# Patient Record
Sex: Female | Born: 1935 | Race: White | Hispanic: No | Smoking: Never smoker
Health system: Southern US, Community
[De-identification: ages and names within clinical notes are randomized; demographics above are authoritative.]

## PROBLEM LIST (undated history)

## (undated) DIAGNOSIS — I10 Essential (primary) hypertension: Secondary | ICD-10-CM

## (undated) DIAGNOSIS — C801 Malignant (primary) neoplasm, unspecified: Secondary | ICD-10-CM

## (undated) HISTORY — PX: ABDOMINAL HYSTERECTOMY: SHX81

---

## 2005-09-08 ENCOUNTER — Other Ambulatory Visit: Payer: Self-pay

## 2005-09-08 ENCOUNTER — Emergency Department: Payer: Self-pay | Admitting: Emergency Medicine

## 2005-09-21 ENCOUNTER — Emergency Department: Payer: Self-pay | Admitting: Emergency Medicine

## 2005-09-21 ENCOUNTER — Other Ambulatory Visit: Payer: Self-pay

## 2005-09-26 ENCOUNTER — Inpatient Hospital Stay: Payer: Self-pay | Admitting: Internal Medicine

## 2005-09-26 ENCOUNTER — Other Ambulatory Visit: Payer: Self-pay

## 2005-12-10 ENCOUNTER — Ambulatory Visit: Payer: Self-pay | Admitting: Unknown Physician Specialty

## 2006-08-29 ENCOUNTER — Ambulatory Visit: Payer: Self-pay

## 2008-11-05 ENCOUNTER — Ambulatory Visit: Payer: Self-pay | Admitting: Ophthalmology

## 2008-11-19 ENCOUNTER — Ambulatory Visit: Payer: Self-pay | Admitting: Ophthalmology

## 2010-01-15 ENCOUNTER — Emergency Department: Payer: Self-pay | Admitting: Emergency Medicine

## 2012-02-18 ENCOUNTER — Inpatient Hospital Stay: Payer: Self-pay | Admitting: Internal Medicine

## 2012-02-18 LAB — COMPREHENSIVE METABOLIC PANEL
Alkaline Phosphatase: 128 U/L (ref 50–136)
Anion Gap: 8 (ref 7–16)
BUN: 28 mg/dL — ABNORMAL HIGH (ref 7–18)
Bilirubin,Total: 0.3 mg/dL (ref 0.2–1.0)
Creatinine: 1.27 mg/dL (ref 0.60–1.30)
EGFR (African American): 44 — ABNORMAL LOW
Glucose: 134 mg/dL — ABNORMAL HIGH (ref 65–99)
Osmolality: 283 (ref 275–301)
SGPT (ALT): 11 U/L — ABNORMAL LOW (ref 12–78)
Sodium: 138 mmol/L (ref 136–145)
Total Protein: 7.8 g/dL (ref 6.4–8.2)

## 2012-02-18 LAB — CK TOTAL AND CKMB (NOT AT ARMC)
CK, Total: 28 U/L (ref 21–215)
CK-MB: <0.5 ng/mL — ABNORMAL LOW (ref 0.5–3.6)

## 2012-02-18 LAB — CBC
HCT: 32.5 % — ABNORMAL LOW (ref 35.0–47.0)
HGB: 10.6 g/dL — ABNORMAL LOW (ref 12.0–16.0)
MCHC: 32.5 g/dL (ref 32.0–36.0)
Platelet: 517 10*3/uL — ABNORMAL HIGH (ref 150–440)
WBC: 12 10*3/uL — ABNORMAL HIGH (ref 3.6–11.0)

## 2012-02-18 LAB — URINALYSIS, COMPLETE
Ketone: NEGATIVE
Ph: 6 (ref 4.5–8.0)
Protein: NEGATIVE

## 2012-02-18 LAB — LIPASE, BLOOD: Lipase: 168 U/L (ref 73–393)

## 2012-02-18 LAB — TROPONIN I: Troponin-I: <0.02 ng/mL

## 2012-02-19 LAB — HEMOGLOBIN: HGB: 9.5 g/dL — ABNORMAL LOW (ref 12.0–16.0)

## 2012-02-20 LAB — URINE CULTURE

## 2012-02-20 LAB — CBC WITH DIFFERENTIAL/PLATELET
Basophil #: 0.1 10*3/uL (ref 0.0–0.1)
Basophil %: 0.8 %
Eosinophil #: 0.2 10*3/uL (ref 0.0–0.7)
HCT: 29.2 % — ABNORMAL LOW (ref 35.0–47.0)
HGB: 9.9 g/dL — ABNORMAL LOW (ref 12.0–16.0)
Lymphocyte #: 2.3 10*3/uL (ref 1.0–3.6)
Lymphocyte %: 26.8 %
MCH: 29.4 pg (ref 26.0–34.0)
MCHC: 33.9 g/dL (ref 32.0–36.0)
Monocyte #: 0.8 x10 3/mm (ref 0.2–0.9)
Neutrophil #: 5.2 10*3/uL (ref 1.4–6.5)
Neutrophil %: 60.3 %
Platelet: 400 10*3/uL (ref 150–440)
RDW: 13.3 % (ref 11.5–14.5)
WBC: 8.6 10*3/uL (ref 3.6–11.0)

## 2012-02-21 LAB — CBC WITH DIFFERENTIAL/PLATELET
Basophil %: 0.8 %
Eosinophil %: 2 %
HGB: 9.2 g/dL — ABNORMAL LOW (ref 12.0–16.0)
Lymphocyte #: 1.9 10*3/uL (ref 1.0–3.6)
MCH: 27.9 pg (ref 26.0–34.0)
MCHC: 32.3 g/dL (ref 32.0–36.0)
MCV: 87 fL (ref 80–100)
Monocyte %: 9.6 %
Platelet: 379 10*3/uL (ref 150–440)
RBC: 3.27 10*6/uL — ABNORMAL LOW (ref 3.80–5.20)
WBC: 8.8 10*3/uL (ref 3.6–11.0)

## 2012-02-22 LAB — CBC WITH DIFFERENTIAL/PLATELET
Basophil %: 0.8 %
Eosinophil #: 0.2 10*3/uL (ref 0.0–0.7)
Eosinophil %: 2.8 %
HCT: 27.1 % — ABNORMAL LOW (ref 35.0–47.0)
HGB: 9 g/dL — ABNORMAL LOW (ref 12.0–16.0)
MCH: 28.6 pg (ref 26.0–34.0)
MCHC: 33.2 g/dL (ref 32.0–36.0)
MCV: 86 fL (ref 80–100)
Monocyte #: 0.8 x10 3/mm (ref 0.2–0.9)
Monocyte %: 10.3 %
Neutrophil %: 57.8 %
Platelet: 380 10*3/uL (ref 150–440)
RBC: 3.15 10*6/uL — ABNORMAL LOW (ref 3.80–5.20)
WBC: 7.8 10*3/uL (ref 3.6–11.0)

## 2012-02-23 LAB — SEDIMENTATION RATE: Erythrocyte Sed Rate: 52 mm/hr — ABNORMAL HIGH (ref 0–30)

## 2012-02-23 LAB — URIC ACID: Uric Acid: 6.4 mg/dL — ABNORMAL HIGH (ref 2.6–6.0)

## 2012-02-24 LAB — HEMOGLOBIN: HGB: 10.1 g/dL — ABNORMAL LOW (ref 12.0–16.0)

## 2014-01-06 ENCOUNTER — Emergency Department: Payer: Self-pay | Admitting: Emergency Medicine

## 2014-01-06 LAB — CBC
HCT: 36.3 % (ref 35.0–47.0)
HGB: 11.1 g/dL — AB (ref 12.0–16.0)
MCH: 24.6 pg — AB (ref 26.0–34.0)
MCHC: 30.7 g/dL — ABNORMAL LOW (ref 32.0–36.0)
MCV: 80 fL (ref 80–100)
PLATELETS: 318 10*3/uL (ref 150–440)
RBC: 4.52 10*6/uL (ref 3.80–5.20)
RDW: 17.8 % — ABNORMAL HIGH (ref 11.5–14.5)
WBC: 7.9 10*3/uL (ref 3.6–11.0)

## 2014-01-06 LAB — BASIC METABOLIC PANEL
Anion Gap: 5 — ABNORMAL LOW (ref 7–16)
BUN: 22 mg/dL — ABNORMAL HIGH (ref 7–18)
CALCIUM: 8.9 mg/dL (ref 8.5–10.1)
CHLORIDE: 112 mmol/L — AB (ref 98–107)
Co2: 26 mmol/L (ref 21–32)
Creatinine: 1.11 mg/dL (ref 0.60–1.30)
EGFR (African American): 60 — ABNORMAL LOW
GFR CALC NON AF AMER: 49 — AB
Glucose: 105 mg/dL — ABNORMAL HIGH (ref 65–99)
Osmolality: 289 (ref 275–301)
Potassium: 4.6 mmol/L (ref 3.5–5.1)
Sodium: 143 mmol/L (ref 136–145)

## 2014-01-06 LAB — URINALYSIS, COMPLETE
BILIRUBIN, UR: NEGATIVE
Bacteria: NONE SEEN
Blood: NEGATIVE
GLUCOSE, UR: NEGATIVE mg/dL (ref 0–75)
Hyaline Cast: 2
KETONE: NEGATIVE
LEUKOCYTE ESTERASE: NEGATIVE
NITRITE: NEGATIVE
PROTEIN: NEGATIVE
Ph: 5 (ref 4.5–8.0)
RBC,UR: <1 /HPF (ref 0–5)
Specific Gravity: 1.01 (ref 1.003–1.030)
WBC UR: <1 /HPF (ref 0–5)

## 2014-05-17 NOTE — Consult Note (Signed)
CC: GI bleeding.  The duodenal bulb was strictured down and when I passed the scope into the distal area I could see a large ulcer with a small clot in it and not bleeding.  The passage of the scope through the narrowed edematous and friable mid duodenum began oozing some blood.  This was diffuse and no central spot.  I decided not to treat this as cauterization would be very technically difficult due to it being in a strictured area.  it should stop on its own as the mucosa of the stricture comes together when the scope was withdrawn.  Will keep NPO and follow hemoglobins.  Electronic Signatures: Manya Silvas (MD)  (Signed on 25-Jan-14 16:09)  Authored  Last Updated: 25-Jan-14 16:09 by Manya Silvas (MD)

## 2014-05-17 NOTE — Consult Note (Signed)
Pt CC: duodenal ulcer and duodenitis with bleed.  Pt VSS and no abd pain or distention or vomiting.  Will repeat CBC today and start full liquid diet.  Abd flat and soft on exam.  Pt color OK.  Small dose of morphine giving small amt of relief of wrist.  Start liquid tylenol.    Electronic Signatures: Manya Silvas (MD)  (Signed on 27-Jan-14 11:10)  Authored  Last Updated: 27-Jan-14 11:10 by Manya Silvas (MD)

## 2014-05-17 NOTE — Consult Note (Signed)
PATIENT NAME:  Elizabeth Castillo, Elizabeth Castillo MR#:  [id number] DATE OF BIRTH:  02/25/1923  DATE OF CONSULTATION:  02/19/2012  CONSULTING PHYSICIAN:  Manya Silvas, MD  HISTORY OF PRESENT ILLNESS: The patient is an 81 year old white female who was bothered by arthritis in her hands, went to a walk-in clinic, got a prescription for nonsteroidal anti-inflammatory drugs, given a 90-day supply, which she took apparently over a 10-day period. She developed significant epigastric pain and black stool. Was seen in the ER, baseline hemoglobin is supposed to be 13, her hemoglobin was 10 and her stool was positive and black, so she was admitted to the hospital. I was asked to see her in consultation.   REVIEW OF SYSTEMS: She does have epigastric pain, nausea. No vomiting. No diarrhea. Does have melena. She denies chest pain. Denies shortness of breath. Denies fever. No dysuria, no hematuria, no frequency. She has a history of ulcer disease in the past on previous endoscopy. She denies irregular heartbeats. No chest pains.   SOCIAL HISTORY: Nonsmoker, nondrinker.   PAST MEDICAL HISTORY: Hypertension, elevated cholesterol, diverticulosis.   PAST SURGICAL HISTORY: Cholecystectomy, hysterectomy and a history of dysphagia.   FAMILY HISTORY: Father with prostate cancer. Sister with breast cancer. Mother with cancer of unknown type.   MEDICATIONS ON ADMISSION:  1. Sertraline 150 mg a day.  2. HCTZ 25 mg a day.  3. Felodipine 10 mg a day.  4. Flonase nasal spray.   ALLERGIES: No known drug allergies.   PHYSICAL EXAMINATION:  GENERAL: Elderly white female, pleasant, cooperative, in no acute distress.  VITAL SIGNS: Temperature 98.6, pulse 72, respirations 18, blood pressure 112/64, O2 sat 98% on room air.  HEENT: Sclerae nonicteric. Conjunctivae slightly pale. Tongue negative. The head is atraumatic.  CHEST: Clear.  HEART: No murmurs or gallops I can hear.  ABDOMEN: Flat. Bowel sounds are present. No  hepatosplenomegaly. No masses. No bruits. There is mild tenderness in epigastric area.  EXTREMITIES: No edema.  RECTAL: Exam done in the ER showed black heme-positive stool.   LABORATORY DATA: White count 12,000. BUN 28, creatinine 1.27. Hemoglobin was 10.6 last night, 9.5 this morning.   ASSESSMENT: Excessive use of nonsteroidal anti-inflammatory drugs because of pain from arthritis in her hands, causing GI bleeding, probably from gastric or duodenal ulcers, duodenitis or gastritis.   PLAN: Perform upper endoscopy later today to see what the cause of the problem is so we will know how long she might be in the hospital and how much and how fast we can advance her diet. She is in agreement with the procedure.     ____________________________ Manya Silvas, MD rte:OSi D: 02/19/2012 11:42:33 ET T: 02/19/2012 12:13:53 ET JOB#: [id number]  cc: Manya Silvas, MD, <Dictator> [place] Pollie Friar, MD Epifanio Lesches, MD   Manya Silvas MD ELECTRONICALLY SIGNED 03/16/2012 7:55

## 2014-05-17 NOTE — H&P (Signed)
PATIENT NAME:  Elizabeth Castillo, Elizabeth Castillo MR#:  [id number] DATE OF BIRTH:  11/11/1923  DATE OF ADMISSION:  02/18/2012  PRIMARY CARE PHYSICIAN:  [facility].   REFERRING PHYSICIAN:  Dr. Pollie Friar.    CHIEF COMPLAINT:  Epigastric pain and black stool.   HISTORY OF PRESENT ILLNESS:  The patient is an 81 year old Caucasian female with history of peptic ulcer disease, specifically is duodenal ulcer.  The patient was in her usual state of health until about three weeks ago when she developed some pain in her hands related to arthritis.  She went to [facility].  The patient was given 90 day supply of nonsteroidal anti-inflammatory medication to be taken every 8 hours.  It appears that the patient took the whole medicine within 10 days period of time and perhaps within 8 days.  Since that time she developed epigastric pain for the last one week associated with back stool.  She came to the Emergency Department for evaluation of her epigastric pain and the black tarry stool.  She was found to be anemic with a hemoglobin of 10.  Her baseline is 13.  Her stool guaiac was positive.  The patient was admitted for further evaluation after consultation with Dr. Vira Agar, her gastroenterologist and he is planning to do upper endoscopy in the morning and recommended to start the intravenous Protonix.   REVIEW OF SYSTEMS:  CONSTITUTIONAL:  Denies having any fever.  No chills.  No fatigue.  EYES:  No blurring of vision.  No double vision.  EARS, NOSE, THROAT:  No hearing impairment.  No sore throat.  No dysphagia.  CARDIOVASCULAR:  No chest pain.  No shortness of breath.  No edema.  No syncope.  RESPIRATORY:  No cough.  No sputum production.  No chest pain.  No shortness of breath.  GASTROINTESTINAL:  Describes epigastric pain radiating to the back, no vomiting, no diarrhea.  No hematochezia, but reports black stools.  GENITOURINARY:  No dysuria.  No frequency of urination.  MUSCULOSKELETAL:  No joint pain or  swelling other than her hand arthritis.  No muscular pain or swelling.  INTEGUMENTARY:  No skin rash.  No ulcers.  NEUROLOGY:  No focal weakness.  No seizure activity.  No headache.  PSYCHIATRY:  She has history of depression.  No anxiety.  ENDOCRINE:  No polyuria or polydipsia.  No heat or cold intolerance.  HEMATOLOGY:  No easy bruisability.  No lymph node enlargement.   PAST MEDICAL HISTORY:  History of duodenal ulcer followed up by Dr. Vira Agar, systemic hypertension, hypercholesterolemia, depression, seasonal allergies, diverticulosis.   PAST SURGICAL HISTORY:  Cholecystectomy, hysterectomy and surgical intervention for dysphagia.   SOCIAL HABITS:  Nonsmoker.  No history of alcohol or drug abuse.   SOCIAL HISTORY:  She is widowed.  Lives at home alone and visited by her daughter.   FAMILY HISTORY:  Her father had prostate cancer.  She has a sister who had breast cancer.  Her mother had cancer, but does not know the type of cancer.   ADMISSION MEDICATIONS:  Sertraline 150 mg once a day, hydrochlorothiazide 25 mg a day, felodipine 10 mg once a day.  She finished all the nonsteroidal anti-inflammatory medication.  She also takes Flonase nasal spray.   ALLERGIES:  No known drug allergies.   PHYSICAL EXAMINATION: VITAL SIGNS:  Blood pressure 159/75, respiratory rate 18, pulse 92, temperature 98.9, oxygen saturation 98%.  GENERAL APPEARANCE:  Elderly female lying in bed in no acute distress.  HEAD AND  NECK:  No pallor.  No icterus.  No cyanosis.  EARS, NOSE, THROAT:  Ear examination revealed normal hearing, no discharge, no lesions.  Nasal mucosa was normal without ulcers.  No discharge.  No lesions.  Oropharyngeal area was normal without ulcers, no oral thrush.  She is edentulous and has dentures.  EYES:  Normal eyelids and conjunctivae.  Pupils about 4 to 5 mm, equal, slowly reactive to light.  NECK:  Supple.  Trachea at midline.  No thyromegaly.  No cervical lymphadenopathy.  No masses.   HEART:  Distant heart sounds.  Faint S1 and S2.  No S3, S4.  No murmur.  No gallop.  No carotid bruits.  RESPIRATORY:  Normal breathing pattern without use of accessory muscles.  No rales.  No wheezing.   ABDOMEN:  Soft without tenderness.  No hepatosplenomegaly.  No masses.  No hernias.  SKIN:  Revealed no ulcers.  No subcutaneous nodules.  MUSCULOSKELETAL:  No joint swelling.  No clubbing.  NEUROLOGIC:  Cranial nerves II-XII are intact.  No focal motor deficit.  PSYCHIATRIC:  The patient is alert, oriented x 3.  Mood and affect were normal.   LABORATORY FINDINGS:  Chest x-ray showed no acute cardiopulmonary abnormalities.  Her EKG showed normal sinus rhythm at a rate of 92 per minute.  Unremarkable EKG.  Serum sodium was 138, potassium 4, BUN 28, creatinine 1.2.  Serum glucose 134.  Calcium 9.4.  Normal liver function tests and liver transaminases.  Albumin was slightly low at 3.2.  Total CPK 28.  Troponin less than 0.02.  CBC showed white count of 12,000, hemoglobin was 10.6 with hematocrit of 32 and elevated platelet count at 517.  In 2011 her hemoglobin was 13.8 with hematocrit of 41 and platelet of 280.  Her indices, MCV, MCH, MCHC are normal 8, 628, 32 respectively.  Urinalysis showed 29 white blood cells, 3 RBCs.  Erythrocyte sedimentation rate was elevated at 67.   ASSESSMENT: 1.  Epigastric pain, likely secondary to recurrence of peptic ulcer disease, either gastritis or gastric ulcer or duodenitis or duodenal ulcer induced by ingesting nonsteroidal anti-inflammatory medication that the patient exceeded her doses.   2.  Melena, indicative of upper gastrointestinal bleed.  3.  Anemia, related to her upper gastrointestinal bleed.  4.  Osteoarthritis.  5.  Systemic hypertension.  6.  Depression.  7.  Hypercholesterolemia.  8.  History of cholecystectomy and hysterectomy.   PLAN:  We will admit the patient to the medical floor.  Follow up on the hemoglobin.  Keep patient nothing by mouth  in preparation for upper endoscopy.  GI consultation with Dr. Vira Agar.  IV Protonix.  I spoke with the patient and her daughter about the findings and the plan.   Apparently, the patient has a LIVING WILL, but it was difficult to ascertain her wishes regarding medical issues and CODE STATUS.  At the time being, she is FULL CODE unless she change her mind.   TIME SPENT IN EVALUATING THIS PATIENT:  Took more than 55 minutes.    ____________________________ Clovis Pu. Lenore Manner, MD amd:ea D: 02/18/2012 23:33:46 ET T: 02/19/2012 02:36:42 ET JOB#: [id number]  cc: Clovis Pu. Lenore Manner, MD, <Dictator> Ellin Saba MD ELECTRONICALLY SIGNED 02/19/2012 22:41

## 2014-05-17 NOTE — Consult Note (Signed)
CC: duodenal ulcer due to NSAID>  Pt VSS afebrile, abd not distended, not tender, no HSM.  hgb slightly down. Complains of pain in left wrist.  Dr. Merita Norton to see her.  Will follow hgb.  Electronic Signatures: Manya Silvas (MD)  (Signed on 28-Jan-14 16:26)  Authored  Last Updated: 28-Jan-14 16:26 by Manya Silvas (MD)

## 2014-05-17 NOTE — Consult Note (Signed)
PATIENT NAME:  Elizabeth Castillo, Elizabeth Castillo MR#:  [id number] DATE OF BIRTH:  October 31, 1923  DATE OF CONSULTATION:  02/22/2012  REFERRING PHYSICIAN:  Dr. Lenore Manner, hospitalist   CONSULTING PHYSICIAN:  Emmaline Kluver., MD  REASON FOR CONSULTATION:  Left wrist pain.  An 81 year old white female. She has good rheumatic health. She said about a month ago she started having pain and swelling in the left wrist.  She remembers no injury. She had a prior history of peptic ulcer disease, saw acute care, has had an x-ray and said it could be "shingles or arthritis." She was not splinted. She was given medicine that she took 3 times a day, unsure of the name. She also took some Tylenol and soaked it.  It has gone down some. Still bothers her with motion. The right hand does not bother her. She has never had podagra and knees, ankles, and toes have not swelled. She remembers no injury. There has been no fever. She hurts with use but it may also bother her at night with stiffness. Feels like the volar aspect of the wrist has been swollen. Some pain in the left thumb. She had hospitalization due to upper GI bleed. She had an endoscopy and was found to have peptic ulcer. Hemoglobin went down to 9, creatinine to 1.2. She had a few white cells in her urine which is asymptomatic. It is not currently bleeding. She has been on Protonix.   PAST MEDICAL HISTORY:  Peptic ulcer disease, hypertension.   PAST SURGICAL HISTORY: Cholecystectomy, hysterectomy.  FAMILY HISTORY:  Prostate cancer.   SOCIAL HISTORY:  No cigarettes or alcohol.   ALLERGIES: None known.  REVIEW OF SYSTEMS: No kidney stones. No significant morning stiffness. No fevers or headaches. No history of psoriasis.   PHYSICAL EXAMINATION: GENERAL:  A pleasant female in no acute distress.   VITAL SIGNS: Temperature 96, pulse 90, respiratory rate 18, blood pressure 150/69, oximetry 96.  MUSCULOSKELETAL:  Performed. Good range of motion in neck and shoulders. Left  wrist synovitis on the volar and dorsal aspect. Mild MCP tenderness. Mild pain with flexion and extension in the left wrist. Right wrist moves well. There is no elbow nodules. Shoulders move well. Knees without effusion. MTP is nontender.   IMPRESSION: 1.  Wrist tenosynovitis. Suspect it was crystalline, either gout or pseudogout. Cannot rule out new onset of an inflammatory arthritis. 2.  History of peptic ulcer disease, exacerbated by nonsteroidal use.  3.  Hypertension.  PLAN: 1.  I offered to aspirate and inject the left wrist. She declined. 2.  We will x-ray, check uric acid, rheumatoid factor.  3.  Left wrist was splinted.  4.  Give her a single dose of IV Solu-Medrol and see if that will calm it down. If not, then we will reapproach her about injecting it.  5.  We will be happy to see her in follow up as an outpatient for this problem.    ____________________________ Emmaline Kluver., MD gwk:ce D: 02/22/2012 17:16:00 ET T: 02/22/2012 17:52:01 ET JOB#: [id number]  cc: Manya Silvas, MD [facility] Emmaline Kluver., MD, <Dictator>  Julieanne Manson Stasia Cavalier MD ELECTRONICALLY SIGNED 02/23/2012 9:54

## 2014-05-17 NOTE — Consult Note (Signed)
CC: bleeding ulcer.  Hgb stable 9-10 range.  Tol full liq diet well, no abd pain or tenderness.  On meds for wrist.  She can go home tomorrow on an ultrasoft and low residue diet on PPI bid forever, take tramadol and tylenol if needed for pain,  ok to take some steroids if not taking NSAID. Follow up in my office in 1-2 weeks.  Electronic Signatures: Manya Silvas (MD)  (Signed on 30-Jan-14 14:31)  Authored  Last Updated: 30-Jan-14 14:31 by Manya Silvas (MD)

## 2014-05-17 NOTE — Consult Note (Signed)
CC: melena.  Pt with large duodenal ulcer and stricture and duodenitis on EGD.  No fall in hgb since today is 9.9,  Stools formed but very dark. plt 400.  VSS afebrile, denies pain.  Started on clear liq diet.  will order something for pain. for her arthritis. low dose morphine .  Follow hgb.   Electronic Signatures: Manya Silvas (MD)  (Signed on 26-Jan-14 11:39)  Authored  Last Updated: 26-Jan-14 11:39 by Manya Silvas (MD)

## 2014-05-17 NOTE — Consult Note (Signed)
Brief Consult Note:   Comments: wrist synovitis,suspect crystalline. declined injection. I splinted. will give one dose of solumedrol IV. xray to look for chondrocalcinosis. check uric acid  ,rheum factor.....Marland Kitchen i can see as outpatient to followup.  Electronic Signatures: Leeanne Mannan., Eugene Gavia (MD)  (Signed 28-Jan-14 17:10)  Authored: Brief Consult Note   Last Updated: 28-Jan-14 17:10 by Leeanne Mannan., Eugene Gavia (MD)

## 2014-05-17 NOTE — Discharge Summary (Signed)
PATIENT NAME:  Elizabeth Castillo, Elizabeth Castillo MR#:  [id number] DATE OF BIRTH:  February 23, 1923  DATE OF ADMISSION:  02/18/2012 DATE OF DISCHARGE:  02/25/2012  PRIMARY CARE PHYSICIAN:  [place]  FINAL DIAGNOSES:   1.  Duodenal ulcer and stricture.  2.  Acute hemorrhagic anemia.  3.  Hypertension.  4.  Gout.  5.  Depression.   MEDICATIONS ON DISCHARGE:  Felodipine 10 mg p.o. daily, sertraline which is Zoloft 150 mg daily, colchicine 0.6 mg daily, omeprazole 20 mg twice a day. I advised her not to take the hydrochlorothiazide. No aspirin, Advil, Aleve or Motrin.   DIET:  Low-sodium diet, ultra-soft diet, low-residue diet.   ACTIVITY:  Activity as tolerated.   FOLLOWUP:  With gastroenterology, Dr. Vira Agar, in 1 to 2 weeks.   HOSPITAL COURSE:  The patient was admitted on February 18, 2012 and discharged on February 25, 2012. The patient came in with epigastric pain and black stool.   HISTORY OF PRESENT ILLNESS:  An 81 year old female with history of ulcer disease. She had pain in her hands and went to the walk-in clinic. She was given anti-inflammatories and took for about 8 days. She was found to be anemic with black stool and hospitalist services were called for admission. GI consultation by Dr. Vira Agar was done. An upper endoscopy on February 19, 2012 showed normal esophagus, normal stomach, hemorrhagic duodenopathy, 1 duodenal ulcer, inflammatory stricture in the distal bulb.   LABORATORY AND RADIOLOGICAL DATA DURING THE HOSPITAL COURSE:  EKG showed normal sinus rhythm and LVH. Urine culture showed contamination. Sedimentation rate was 67. Troponin was negative. Lipase was 168. White blood cell count 12.0, H and H 10.6 and 32.5, platelet count 517. Glucose was 134, BUN 28, creatinine 1.27, sodium 138, potassium 4.0, chloride 104, CO2 was 26, calcium 9.4. Liver function tests: Albumin low at 3.2. Urinalysis showed 3+ leukocyte esterase. Chest x-ray showed no acute cardiopulmonary disease.  Ultrasound of the abdomen showed no evidence of gallbladder, previous cholecystectomy, common bile duct distended, but within normal limits. Hemoglobin has gone as low as 9.0. Left wrist complete showed no osseous abnormality of left wrist. Sedimentation rate repeated was 52. Rheumatoid arthritis factor was 9.4. Uric acid was 6.4. Hemoglobin is 10.1 upon discharge.   The patient was seen in consultation by Dr. Precious Reel on February 22, 2012, for left wrist pain. The patient was given an IV dose of Solu-Medrol at that time. She refused wrist aspiration.   HOSPITAL COURSE PER PROBLEM LIST:  1.  For the patient's epigastric pain and melena, the patient was found to have a duodenal ulcer and stricture. The patient was given IV Protonix during the hospital course and switched over to omeprazole upon discharge. She was on a liquid diet most of the hospital stay and switched over to an [place] upon discharge. Follow up with Dr. Vira Agar in 1 to 2 weeks for further evaluation.  2.  For the patient's acute hemorrhagic anemia, the patient's hemoglobin dipped down as low as 9, but was 10.1 upon discharge. I will not give iron at this point in time. Continue to watch as outpatient. Recommend the hemoglobin at followup appointment with Dr. Vira Agar.  3.  Hypertension. Continue felodipine. Stop hydrochlorothiazide.  4.  Gout. Colchicine was continued on a daily basis. The patient was seen in consultation by Dr. Jefm Bryant who gave a few doses of IV steroids. Left wrist pain has improved.  5.  Depression. Continue Zoloft.   TIME SPENT ON  DISCHARGE:  35 minutes.    ____________________________ Tana Conch. Leslye Peer, MD rjw:si D: 02/25/2012 16:07:34 ET T: 02/25/2012 17:42:05 ET JOB#: [id number]  cc: Tana Conch. Leslye Peer, MD, <Dictator> [facility] Manya Silvas, MD Emmaline Kluver., MD   Marisue Brooklyn MD ELECTRONICALLY SIGNED 03/01/2012 13:12

## 2014-05-17 NOTE — Consult Note (Signed)
Pt CC: bleeding duodenal ulcer.  Pt hgb stable at 9, no abd pain or tenderness on my exam..  Splint for wrist and steroids started.  Will advance to full liquid diet and hopeful discharge Friday.  Electronic Signatures: Manya Silvas (MD)  (Signed on 29-Jan-14 10:00)  Authored  Last Updated: 29-Jan-14 10:00 by Manya Silvas (MD)

## 2015-01-02 ENCOUNTER — Other Ambulatory Visit: Payer: Self-pay | Admitting: Internal Medicine

## 2015-01-02 DIAGNOSIS — Z13820 Encounter for screening for osteoporosis: Secondary | ICD-10-CM

## 2015-01-13 ENCOUNTER — Ambulatory Visit: Payer: Self-pay | Attending: Internal Medicine

## 2016-02-03 ENCOUNTER — Emergency Department: Payer: Medicare Other

## 2016-02-03 ENCOUNTER — Emergency Department
Admission: EM | Admit: 2016-02-03 | Discharge: 2016-02-04 | Disposition: A | Discharge status: Home / Self Care | Payer: Medicare Other | Attending: Emergency Medicine | Admitting: Emergency Medicine

## 2016-02-03 DIAGNOSIS — R1013 Epigastric pain: Secondary | ICD-10-CM | POA: Diagnosis not present

## 2016-02-03 DIAGNOSIS — R1110 Vomiting, unspecified: Secondary | ICD-10-CM

## 2016-02-03 DIAGNOSIS — Z85828 Personal history of other malignant neoplasm of skin: Secondary | ICD-10-CM | POA: Insufficient documentation

## 2016-02-03 DIAGNOSIS — R5383 Other fatigue: Secondary | ICD-10-CM | POA: Diagnosis present

## 2016-02-03 DIAGNOSIS — R112 Nausea with vomiting, unspecified: Secondary | ICD-10-CM | POA: Insufficient documentation

## 2016-02-03 DIAGNOSIS — I10 Essential (primary) hypertension: Secondary | ICD-10-CM | POA: Insufficient documentation

## 2016-02-03 HISTORY — DX: Malignant (primary) neoplasm, unspecified: C80.1

## 2016-02-03 HISTORY — DX: Essential (primary) hypertension: I10

## 2016-02-03 LAB — CBC
HCT: 40.2 % (ref 35.0–47.0)
HEMOGLOBIN: 13.1 g/dL (ref 12.0–16.0)
MCH: 28.2 pg (ref 26.0–34.0)
MCHC: 32.6 g/dL (ref 32.0–36.0)
MCV: 86.6 fL (ref 80.0–100.0)
Platelets: 258 10*3/uL (ref 150–440)
RBC: 4.64 MIL/uL (ref 3.80–5.20)
RDW: 15.4 % — ABNORMAL HIGH (ref 11.5–14.5)
WBC: 8 10*3/uL (ref 3.6–11.0)

## 2016-02-03 LAB — BASIC METABOLIC PANEL
ANION GAP: 7 (ref 5–15)
BUN: 17 mg/dL (ref 6–20)
CHLORIDE: 108 mmol/L (ref 101–111)
CO2: 24 mmol/L (ref 22–32)
Calcium: 9.5 mg/dL (ref 8.9–10.3)
Creatinine, Ser: 1.01 mg/dL — ABNORMAL HIGH (ref 0.44–1.00)
GFR calc non Af Amer: 48 mL/min — ABNORMAL LOW (ref >60)
GFR, EST AFRICAN AMERICAN: 55 mL/min — AB (ref >60)
Glucose, Bld: 146 mg/dL — ABNORMAL HIGH (ref 65–99)
POTASSIUM: 4.3 mmol/L (ref 3.5–5.1)
Sodium: 139 mmol/L (ref 135–145)

## 2016-02-03 LAB — TROPONIN I: Troponin I: <0.03 ng/mL (ref <0.03)

## 2016-02-03 MED ORDER — ONDANSETRON HCL 4 MG/2ML IJ SOLN
INTRAMUSCULAR | Status: AC
  Filled 2016-02-03: qty 2

## 2016-02-03 MED ORDER — ONDANSETRON HCL 4 MG/2ML IJ SOLN
4.0000 mg | Freq: Once | INTRAMUSCULAR | Status: AC
  Administered 2016-02-03: 4 mg via INTRAVENOUS
  Filled 2016-02-03: qty 2

## 2016-02-03 MED ORDER — ONDANSETRON HCL 4 MG/2ML IJ SOLN
4.0000 mg | Freq: Once | INTRAMUSCULAR | Status: AC
  Administered 2016-02-03: 23:00:00 via INTRAVENOUS

## 2016-02-03 MED ORDER — ONDANSETRON 4 MG PO TBDP: 4.0000 mg | ORAL_TABLET | Freq: Three times a day (TID) | ORAL | 0 refills | Status: DC | PRN

## 2016-02-03 MED ORDER — SODIUM CHLORIDE 0.9 % IV BOLUS (SEPSIS)
1000.0000 mL | Freq: Once | INTRAVENOUS | Status: AC
  Administered 2016-02-03: 1000 mL via INTRAVENOUS

## 2016-02-03 MED ORDER — IOPAMIDOL (ISOVUE-300) INJECTION 61%
75.0000 mL | Freq: Once | INTRAVENOUS | Status: AC | PRN
  Administered 2016-02-03: 75 mL via INTRAVENOUS

## 2016-02-03 MED ORDER — IOPAMIDOL (ISOVUE-300) INJECTION 61%
30.0000 mL | Freq: Once | INTRAVENOUS | Status: AC
  Administered 2016-02-03: 30 mL via ORAL

## 2016-02-03 NOTE — ED Notes (Addendum)
This RN went into room to discharge pt.  Pts son talking on phone, this RN requested pts son to end call in order to go over discharge instructions.  Pts son stated "I love the service you get at this place" and hung up phone.  Pts son then proceeded to step out of room and close door. This RN helped pt to BR and put pts belongings (purse, [employer], discharge instructions, CD w/ XR that pt brought) in bag.  Had pt sit in chair, asked son to step into room to watch pt while this RN got wheelchair.  Pts son refused to make eye contact w/ this RN

## 2016-02-03 NOTE — ED Triage Notes (Signed)
Pt arrives to ER via POV c/o for body aches, weakness and fatigue X 1 day. Pt tested negative for flu at [facility], chest xray sent with patient. Pt alert and oriented X4, active, cooperative, pt in NAD. RR even and unlabored, color WNL.

## 2016-02-03 NOTE — Discharge Instructions (Signed)
Please take a clear liquid diet for the next 24 hours, then advance your diet as tolerated.  Turn to the emergency department if you develop pain, fever, inability to keep down fluids, lightheadedness or fainting, or any other symptoms concerning to you.

## 2016-02-03 NOTE — ED Notes (Signed)
Patient transported to CT 

## 2016-02-03 NOTE — ED Notes (Signed)
Lab reports ordered add-on for troponin needs to be redrawn; insufficient amount to be added-on to blood drawn earlier

## 2016-02-03 NOTE — ED Notes (Signed)
Pt c/o nausea w/ no vomiting and +PO intake.  Pt sts that she has cough, but that's normal for her.

## 2016-02-03 NOTE — ED Provider Notes (Signed)
[facility] Emergency Department Provider Note  ____________________________________________  Time seen: Approximately 10:02 PM  I have reviewed the triage vital signs and the nursing notes.   HISTORY  Chief Complaint Fatigue    HPI Elizabeth Castillo is a 83 y.o. female a history of [facility] and cough. The patient states that her cough is chronic and that is not the reason she is here. She describes that since last night she has been having dry heaves and every time she eats or drinks something she is able to keep it down, but she feels like she might throw up. She is a poor historian and states that she's been having some abdominal pain, does not have any right now, and cannot describe the pain that she was having. Her last bowel movement was 2 days ago and it was normal. No diarrhea, fever or chills, urinary symptoms. No shortness breath.  History of hysterectomy and cholecystectomy.   Past Medical History:  Diagnosis Date  . Cancer ([place])    skin cancer  . Hypertension     There are no active problems to display for this patient.   History reviewed. No pertinent surgical history.    Allergies Patient has no known allergies.  No family history on file.  Social History Social History  Substance Use Topics  . Smoking status: Never Smoker  . Smokeless tobacco: Not on file  . Alcohol use No    Review of Systems Constitutional: No fever/chills.No lightheadedness or syncope. Eyes: No visual changes. ENT: No sore throat. No congestion or rhinorrhea. Cardiovascular: Denies chest pain. Denies palpitations. Respiratory: Denies shortness of breath.  No cough. Gastrointestinal: Positive abdominal pain.  Positive nausea, no vomiting.  No diarrhea.  No constipation. Genitourinary: Negative for dysuria. Musculoskeletal: Negative for back pain. Skin: Negative for rash. Neurological: Negative for  headaches. No focal numbness, tingling or weakness.   10-point ROS otherwise negative.  ____________________________________________   PHYSICAL EXAM:  VITAL SIGNS: ED Triage Vitals [02/03/16 1532]  Enc Vitals Group     BP 137/68     Pulse Rate (!) 115     Resp 18     Temp 100.3 F (37.9 C)     Temp Source Oral     SpO2 96 %     Weight 160 lb (72.6 kg)     Height 5\' 3"  (1.6 m)     Head Circumference      Peak Flow      Pain Score      Pain Loc      Pain Edu?      Excl. in [place]?     Constitutional: Alert and oriented but poor historian. Chronically ill appearing but in no acute distress. Answers questions appropriately. Eyes: Conjunctivae are normal.  EOMI. No scleral icterus. Head: Atraumatic. Nose: No congestion/rhinnorhea. Mouth/Throat: Mucous membranes are dry.  Neck: No stridor.  Supple.  No JVD. No meningismus. Cardiovascular: Normal rate, regular rhythm. No murmurs, rubs or gallops.  Respiratory: Normal respiratory effort.  No accessory muscle use or retractions. Lungs CTAB.  No wheezes, rales or ronchi. Gastrointestinal: Soft, nontender and nondistended.  No guarding or rebound.  No peritoneal signs. Musculoskeletal: No LE edema.  Neurologic:  A&Ox3.  Speech is clear.  Face and smile are symmetric.  EOMI.  Moves all extremities well. Skin:  Skin is warm, dry and intact. No rash noted. Psychiatric: Mood and affect are normal. Speech and behavior are normal.  ____________________________________________   LABS (all labs ordered are listed, but only abnormal results are displayed)  Labs Reviewed  BASIC METABOLIC PANEL - Abnormal; Notable for the following:       Result Value   Glucose, Bld 146 (*)    Creatinine, Ser 1.01 (*)    GFR calc non Af Amer 48 (*)    GFR calc Af Amer 55 (*)    All other components within normal limits  CBC - Abnormal; Notable for the following:    RDW 15.4 (*)    All other components within normal limits  TROPONIN I  URINALYSIS,  COMPLETE (UACMP) WITH MICROSCOPIC  CBG MONITORING, ED   ____________________________________________  EKG  ED ECG REPORT I, Eula Listen, the attending physician, personally viewed and interpreted this ECG.   Date: 02/03/2016  EKG Time: 1538  Rate: 114  Rhythm: sinus tachycardia  Axis: leftward  Intervals:none  ST&T Change: No STEMI  ____________________________________________  RADIOLOGY  Ct Abdomen Pelvis W Contrast  Result Date: 02/03/2016 CLINICAL DATA:  Generalized abdomen pain nausea with no vomiting EXAM: CT ABDOMEN AND PELVIS WITH CONTRAST TECHNIQUE: Multidetector CT imaging of the abdomen and pelvis was performed using the standard protocol following bolus administration of intravenous contrast. CONTRAST:  9mL ISOVUE-300 IOPAMIDOL (ISOVUE-300) INJECTION 61% COMPARISON:  01/15/2010 FINDINGS: Lower chest: Lung bases demonstrate no acute consolidation or pleural effusion. The heart is nonenlarged. No large effusion. Hepatobiliary: Calcified granuloma in the liver. Surgical absence of the gallbladder. Prominent extrahepatic common bile duct felt secondary to post cholecystectomy change, similar compared to prior CT. No intra hepatic biliary dilatation. Pancreas: Unremarkable. No pancreatic ductal dilatation or surrounding inflammatory changes. Spleen: Small accessory splenule. Spleen otherwise unremarkable splenic granuloma Adrenals/Urinary Tract: Adrenal glands are within normal limits. 1.2 cm cyst mid to upper left kidney. No hydronephrosis. Bladder unremarkable Stomach/Bowel: Stomach unremarkable. No dilated small bowel. No colon wall thickening. Moderate stool in the colon. Sigmoid colon diverticular disease without acute wall thickening or inflammation Vascular/Lymphatic: Aortic atherosclerosis. No enlarged abdominal or pelvic lymph nodes. Reproductive: Status post hysterectomy. No adnexal masses. Other: No free air or free fluid. Musculoskeletal: No suspicious bone lesion.  Posterior calcified disc at L4-L5. IMPRESSION: 1. No CT evidence for acute intra-abdominal or pelvic pathology 2. Evidence of prior granulomatous disease 3. Post cholecystectomy. Prominent common bile duct likely related to post cholecystectomy change. Electronically Signed   By: Donavan Foil M.D.   On: 02/03/2016 23:32    ____________________________________________   PROCEDURES  Procedure(s) performed: None  Procedures  Critical Care performed: No ____________________________________________   INITIAL IMPRESSION / ASSESSMENT AND PLAN / ED COURSE  Pertinent labs & imaging results that were available during my care of the patient were reviewed by me and considered in my medical decision making (see chart for details).  83 y.o. female presenting with dry heaving since last night. Overall, the patient's vital signs are reassuring and she is afebrile. Her abdomen is soft nontender nondistended, but she does have a history of remote intra-abdominal surgery and I'm concerned about small bowel or partial small bowel obstruction. Her white blood cell count is reassuring. Clinically, will treat her with an antiemetic and fluids, and continue to monitor her sinus tachycardia which may be from mild dehydration. Plan reevaluation for final disposition.  ----------------------------------------- 11:42 PM on 02/03/2016 -----------------------------------------  The patient's CT scan does not show any acute process.  She has been able to keep down liquids, plan discharge.  Return precautions and f/u instructions were discussed w/ the  pt and her son.  ____________________________________________  FINAL CLINICAL IMPRESSION(S) / ED DIAGNOSES  Final diagnoses:  Dry heaves  Epigastric pain    Clinical Course       NEW MEDICATIONS STARTED DURING THIS VISIT:  New Prescriptions   No medications on file      Eula Listen, MD 02/03/16 2343

## 2016-02-03 NOTE — ED Notes (Signed)
ED Provider at bedside. 

## 2016-02-03 NOTE — ED Notes (Signed)
Reviewed pt's triage; order added

## 2016-05-21 ENCOUNTER — Emergency Department
Admission: EM | Admit: 2016-05-21 | Discharge: 2016-05-21 | Disposition: A | Discharge status: Home / Self Care | Payer: Medicare Other | Attending: Emergency Medicine | Admitting: Emergency Medicine

## 2016-05-21 ENCOUNTER — Emergency Department: Payer: Medicare Other

## 2016-05-21 ENCOUNTER — Encounter: Payer: Self-pay | Admitting: Emergency Medicine

## 2016-05-21 DIAGNOSIS — M25561 Pain in right knee: Secondary | ICD-10-CM | POA: Diagnosis present

## 2016-05-21 DIAGNOSIS — M1710 Unilateral primary osteoarthritis, unspecified knee: Secondary | ICD-10-CM

## 2016-05-21 DIAGNOSIS — M1711 Unilateral primary osteoarthritis, right knee: Secondary | ICD-10-CM | POA: Diagnosis not present

## 2016-05-21 DIAGNOSIS — I10 Essential (primary) hypertension: Secondary | ICD-10-CM | POA: Diagnosis not present

## 2016-05-21 DIAGNOSIS — Z85828 Personal history of other malignant neoplasm of skin: Secondary | ICD-10-CM | POA: Insufficient documentation

## 2016-05-21 LAB — CBC WITH DIFFERENTIAL/PLATELET
BASOS ABS: 0.1 10*3/uL (ref 0–0.1)
Basophils Relative: 1 %
EOS PCT: 1 %
Eosinophils Absolute: 0.1 10*3/uL (ref 0–0.7)
HEMATOCRIT: 37.6 % (ref 35.0–47.0)
Hemoglobin: 12.2 g/dL (ref 12.0–16.0)
LYMPHS ABS: 1.6 10*3/uL (ref 1.0–3.6)
LYMPHS PCT: 21 %
MCH: 27.4 pg (ref 26.0–34.0)
MCHC: 32.5 g/dL (ref 32.0–36.0)
MCV: 84.3 fL (ref 80.0–100.0)
MONO ABS: 0.7 10*3/uL (ref 0.2–0.9)
Monocytes Relative: 9 %
NEUTROS ABS: 5.4 10*3/uL (ref 1.4–6.5)
Neutrophils Relative %: 68 %
PLATELETS: 322 10*3/uL (ref 150–440)
RBC: 4.46 MIL/uL (ref 3.80–5.20)
RDW: 16.2 % — AB (ref 11.5–14.5)
WBC: 7.8 10*3/uL (ref 3.6–11.0)

## 2016-05-21 LAB — BASIC METABOLIC PANEL
ANION GAP: 7 (ref 5–15)
BUN: 26 mg/dL — AB (ref 6–20)
CALCIUM: 9.4 mg/dL (ref 8.9–10.3)
CO2: 24 mmol/L (ref 22–32)
Chloride: 109 mmol/L (ref 101–111)
Creatinine, Ser: 1.29 mg/dL — ABNORMAL HIGH (ref 0.44–1.00)
GFR calc Af Amer: 41 mL/min — ABNORMAL LOW (ref >60)
GFR calc non Af Amer: 35 mL/min — ABNORMAL LOW (ref >60)
GLUCOSE: 135 mg/dL — AB (ref 65–99)
POTASSIUM: 4.2 mmol/L (ref 3.5–5.1)
Sodium: 140 mmol/L (ref 135–145)

## 2016-05-21 LAB — SYNOVIAL CELL COUNT + DIFF, W/ CRYSTALS
Crystals, Fluid: NONE SEEN
Eosinophils-Synovial: 0 %
LYMPHOCYTES-SYNOVIAL FLD: 8 %
MONOCYTE-MACROPHAGE-SYNOVIAL FLUID: 9 %
NEUTROPHIL, SYNOVIAL: 83 %
Other Cells-SYN: 0
WBC, Synovial: 284 /mm3 — ABNORMAL HIGH (ref 0–200)

## 2016-05-21 LAB — SEDIMENTATION RATE: SED RATE: 37 mm/h — AB (ref 0–30)

## 2016-05-21 MED ORDER — OXYCODONE-ACETAMINOPHEN 5-325 MG PO TABS: 1.0000 | ORAL_TABLET | Freq: Three times a day (TID) | ORAL | 0 refills | Status: DC | PRN

## 2016-05-21 MED ORDER — OXYCODONE-ACETAMINOPHEN 5-325 MG PO TABS
1.0000 | ORAL_TABLET | Freq: Once | ORAL | Status: AC
  Administered 2016-05-21: 1 via ORAL

## 2016-05-21 MED ORDER — OXYCODONE-ACETAMINOPHEN 5-325 MG PO TABS
ORAL_TABLET | ORAL | Status: AC
  Administered 2016-05-21: 1 via ORAL
  Filled 2016-05-21: qty 1

## 2016-05-21 MED ORDER — OXYCODONE-ACETAMINOPHEN 5-325 MG PO TABS
1.0000 | ORAL_TABLET | Freq: Once | ORAL | Status: AC
  Administered 2016-05-21: 1 via ORAL
  Filled 2016-05-21: qty 1

## 2016-05-21 MED ORDER — LIDOCAINE-EPINEPHRINE (PF) 1 %-1:200000 IJ SOLN
30.0000 mL | Freq: Once | INTRAMUSCULAR | Status: AC
  Administered 2016-05-21: 30 mL
  Filled 2016-05-21: qty 30

## 2016-05-21 NOTE — ED Notes (Signed)
Pt discharged to home.  Friend driving.  Discharge instructions reviewed.  Verbalized understanding.  No questions or concerns at this time.  Teach back verified.  Pt in NAD.  No items left in ED.   

## 2016-05-21 NOTE — ED Notes (Signed)
Instructions given for walker use, pt demonstrated to this RN.

## 2016-05-21 NOTE — ED Triage Notes (Signed)
Pt to ED via POV c/o right knee pain. Pt states that this has been going on for "a while". Pt states that she feels like her knee is going to "give out" and yesterday she had trouble walking due to feeling like her knee would give out.   Pt denies falls or recent injury to the area. Pt. Does not appear to be in any distress at this time.

## 2016-05-21 NOTE — ED Notes (Signed)
Pt discussed with Dr. Jimmye Norman. Verbal order given for CBC, BMP, and right knee x-ray.
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
## 2016-05-25 LAB — BODY FLUID CULTURE: CULTURE: NO GROWTH

## 2018-10-13 ENCOUNTER — Encounter: Payer: Self-pay | Admitting: Emergency Medicine

## 2018-10-13 ENCOUNTER — Inpatient Hospital Stay (HOSPITAL_COMMUNITY)
Admission: AD | Admit: 2018-10-13 | Payer: Medicare Other | Source: Other Acute Inpatient Hospital | Admitting: Internal Medicine

## 2018-10-13 ENCOUNTER — Other Ambulatory Visit: Payer: Self-pay

## 2018-10-13 ENCOUNTER — Emergency Department: Payer: Medicare Other

## 2018-10-13 ENCOUNTER — Emergency Department
Admission: EM | Admit: 2018-10-13 | Discharge: 2018-10-13 | Discharge status: Against Medical Advice | Payer: Medicare Other | Attending: Student in an Organized Health Care Education/Training Program | Admitting: Student in an Organized Health Care Education/Training Program

## 2018-10-13 DIAGNOSIS — Z5329 Procedure and treatment not carried out because of patient's decision for other reasons: Secondary | ICD-10-CM | POA: Insufficient documentation

## 2018-10-13 DIAGNOSIS — Z79899 Other long term (current) drug therapy: Secondary | ICD-10-CM | POA: Diagnosis not present

## 2018-10-13 DIAGNOSIS — I10 Essential (primary) hypertension: Secondary | ICD-10-CM | POA: Diagnosis not present

## 2018-10-13 DIAGNOSIS — Z85828 Personal history of other malignant neoplasm of skin: Secondary | ICD-10-CM | POA: Diagnosis not present

## 2018-10-13 DIAGNOSIS — U071 COVID-19: Secondary | ICD-10-CM | POA: Insufficient documentation

## 2018-10-13 DIAGNOSIS — R059 Cough, unspecified: Secondary | ICD-10-CM

## 2018-10-13 DIAGNOSIS — R531 Weakness: Secondary | ICD-10-CM | POA: Diagnosis present

## 2018-10-13 DIAGNOSIS — R05 Cough: Secondary | ICD-10-CM

## 2018-10-13 LAB — HEPATIC FUNCTION PANEL
ALT: 10 U/L (ref 0–44)
AST: 17 U/L (ref 15–41)
Albumin: 3.5 g/dL (ref 3.5–5.0)
Alkaline Phosphatase: 89 U/L (ref 38–126)
Bilirubin, Direct: <0.1 mg/dL (ref 0.0–0.2)
Total Bilirubin: 0.7 mg/dL (ref 0.3–1.2)
Total Protein: 6.8 g/dL (ref 6.5–8.1)

## 2018-10-13 LAB — URINALYSIS, COMPLETE (UACMP) WITH MICROSCOPIC
Bacteria, UA: NONE SEEN
Bilirubin Urine: NEGATIVE
Glucose, UA: NEGATIVE mg/dL
Hgb urine dipstick: NEGATIVE
Ketones, ur: NEGATIVE mg/dL
Nitrite: NEGATIVE
Protein, ur: NEGATIVE mg/dL
Specific Gravity, Urine: 1.018 (ref 1.005–1.030)
pH: 5 (ref 5.0–8.0)

## 2018-10-13 LAB — BASIC METABOLIC PANEL
Anion gap: 9 (ref 5–15)
BUN: 51 mg/dL — ABNORMAL HIGH (ref 8–23)
CO2: 17 mmol/L — ABNORMAL LOW (ref 22–32)
Calcium: 8.6 mg/dL — ABNORMAL LOW (ref 8.9–10.3)
Chloride: 112 mmol/L — ABNORMAL HIGH (ref 98–111)
Creatinine, Ser: 1.82 mg/dL — ABNORMAL HIGH (ref 0.44–1.00)
GFR calc Af Amer: 27 mL/min — ABNORMAL LOW (ref >60)
GFR calc non Af Amer: 24 mL/min — ABNORMAL LOW (ref >60)
Glucose, Bld: 108 mg/dL — ABNORMAL HIGH (ref 70–99)
Potassium: 4.6 mmol/L (ref 3.5–5.1)
Sodium: 138 mmol/L (ref 135–145)

## 2018-10-13 LAB — PROCALCITONIN: Procalcitonin: <0.1 ng/mL

## 2018-10-13 LAB — FIBRIN DERIVATIVES D-DIMER (ARMC ONLY): Fibrin derivatives D-dimer (ARMC): 1083.82 ng/mL (FEU) — ABNORMAL HIGH (ref 0.00–499.00)

## 2018-10-13 LAB — CBC
HCT: 32.1 % — ABNORMAL LOW (ref 36.0–46.0)
Hemoglobin: 9.7 g/dL — ABNORMAL LOW (ref 12.0–15.0)
MCH: 28.1 pg (ref 26.0–34.0)
MCHC: 30.2 g/dL (ref 30.0–36.0)
MCV: 93 fL (ref 80.0–100.0)
Platelets: 196 10*3/uL (ref 150–400)
RBC: 3.45 MIL/uL — ABNORMAL LOW (ref 3.87–5.11)
RDW: 14.3 % (ref 11.5–15.5)
WBC: 4.7 10*3/uL (ref 4.0–10.5)
nRBC: 0 % (ref 0.0–0.2)

## 2018-10-13 LAB — SARS CORONAVIRUS 2 BY RT PCR (HOSPITAL ORDER, PERFORMED IN ~~LOC~~ HOSPITAL LAB): SARS Coronavirus 2: POSITIVE — AB

## 2018-10-13 MED ORDER — SODIUM CHLORIDE 0.9 % IV SOLN
500.0000 mg | INTRAVENOUS | Status: DC
  Administered 2018-10-13: 16:00:00 500 mg via INTRAVENOUS
  Filled 2018-10-13: qty 500

## 2018-10-13 MED ORDER — SODIUM CHLORIDE 0.9 % IV SOLN
2.0000 g | INTRAVENOUS | Status: DC
  Administered 2018-10-13: 2 g via INTRAVENOUS
  Filled 2018-10-13: qty 20

## 2018-10-13 MED ORDER — SODIUM CHLORIDE 0.9 % IV SOLN
Freq: Once | INTRAVENOUS | Status: AC
  Administered 2018-10-13: 19:00:00 via INTRAVENOUS

## 2018-10-13 MED ORDER — DEXAMETHASONE 6 MG PO TABS: 6.0000 mg | ORAL_TABLET | Freq: Every day | ORAL | 0 refills | Status: AC

## 2018-10-13 MED ORDER — LORAZEPAM 0.5 MG PO TABS
0.2500 mg | ORAL_TABLET | Freq: Once | ORAL | Status: AC
  Administered 2018-10-13: 0.25 mg via ORAL
  Filled 2018-10-13: qty 1

## 2018-10-13 NOTE — ED Notes (Signed)
Pt ambulatory to bathroom with 1x assistance. Pt able to void at this time.

## 2018-10-13 NOTE — ED Notes (Signed)
ED Provider Funk at bedside. 

## 2018-10-13 NOTE — ED Provider Notes (Signed)
[facility] Emergency Department Provider Note    First MD Initiated Contact with Patient 10/13/18 1330     (approximate)  I have reviewed the triage vital signs and the nursing notes.   HISTORY  Chief Complaint Weakness, Tremors, and Cough    HPI Elizabeth Castillo is a 85 y.o. female below listed past medical history presents the ER for evaluation of generalized weakness fatigue..  No measured fever but is feeling tremors and chills.  No recent antibiotic use.  Does not wear home oxygen.  Denies any abdominal pain but has had some nausea.    Past Medical History:  Diagnosis Date  . Cancer ([place])    skin cancer  . Hypertension    No family history on file. Past Surgical History:  Procedure Laterality Date  . ABDOMINAL HYSTERECTOMY     There are no active problems to display for this patient.     Prior to Admission medications   Medication Sig Start Date End Date Taking? Authorizing Provider  acetaminophen (TYLENOL) 500 MG tablet Take 1,000 mg by mouth every 6 (six) hours as needed.   Yes [provider]  allopurinol (ZYLOPRIM) 100 MG tablet Take 100 mg by mouth daily. 04/21/18  Yes [provider]  amLODipine (NORVASC) 10 MG tablet Take 10 mg by mouth daily. 10/13/18  Yes [provider]  lisinopril (ZESTRIL) 30 MG tablet Take 30 mg by mouth daily. 10/13/18  Yes [provider]  omeprazole (PRILOSEC) 20 MG capsule Take 20 mg by mouth daily. 10/13/18  Yes [provider]  sertraline (ZOLOFT) 100 MG tablet Take 100 mg by mouth daily. 10/13/18  Yes [provider]    Allergies Patient has no known allergies.    Social History Social History   Tobacco Use  . Smoking status: Never Smoker  . Smokeless tobacco: Never Used  Substance Use Topics  . Alcohol use: No  . Drug use: Not on file    Review of Systems Patient denies headaches, rhinorrhea, blurry vision, numbness, shortness of breath,  chest pain, edema, cough, abdominal pain, nausea, vomiting, diarrhea, dysuria, fevers, rashes or hallucinations unless otherwise stated above in HPI. ____________________________________________   PHYSICAL EXAM:  VITAL SIGNS: Vitals:   10/13/18 1430 10/13/18 1446  BP: (!) 131/39 (!) 133/49  Pulse:  78  Resp: 18 20  Temp:    SpO2:  97%    Constitutional: Alert and oriented.  Eyes: Conjunctivae are normal.  Head: Atraumatic. Nose: No congestion/rhinnorhea. Mouth/Throat: Mucous membranes are moist.   Neck: No stridor. Painless ROM.  Cardiovascular: Normal rate, regular rhythm. Grossly normal heart sounds.  Good peripheral circulation. Respiratory: Normal respiratory effort.  No retractions. Lungs with crackles in right lung fields Gastrointestinal: Soft and nontender. No distention. No abdominal bruits. No CVA tenderness. Genitourinary:  Musculoskeletal: No lower extremity tenderness nor edema.  No joint effusions. Neurologic:  Normal speech and language. No gross focal neurologic deficits are appreciated. No facial droop Skin:  Skin is warm, dry and intact. No rash noted. Psychiatric: Mood and affect are normal. Speech and behavior are normal.  ____________________________________________   LABS (all labs ordered are listed, but only abnormal results are displayed)  Results for orders placed or performed during the hospital encounter of 10/13/18 (from the past 24 hour(s))  Basic metabolic panel     Status: Abnormal   Collection Time: 10/13/18  1:35 PM  Result Value Ref Range   Sodium 138 135 - 145 mmol/L  Potassium 4.6 3.5 - 5.1 mmol/L   Chloride 112 (H) 98 - 111 mmol/L   CO2 17 (L) 22 - 32 mmol/L   Glucose, Bld 108 (H) 70 - 99 mg/dL   BUN 51 (H) 8 - 23 mg/dL   Creatinine, Ser 1.82 (H) 0.44 - 1.00 mg/dL   Calcium 8.6 (L) 8.9 - 10.3 mg/dL   GFR calc non Af Amer 24 (L) >60 mL/min   GFR calc Af Amer 27 (L) >60 mL/min   Anion gap 9 5 - 15  CBC     Status: Abnormal    Collection Time: 10/13/18  1:35 PM  Result Value Ref Range   WBC 4.7 4.0 - 10.5 K/uL   RBC 3.45 (L) 3.87 - 5.11 MIL/uL   Hemoglobin 9.7 (L) 12.0 - 15.0 g/dL   HCT 32.1 (L) 36.0 - 46.0 %   MCV 93.0 80.0 - 100.0 fL   MCH 28.1 26.0 - 34.0 pg   MCHC 30.2 30.0 - 36.0 g/dL   RDW 14.3 11.5 - 15.5 %   Platelets 196 150 - 400 K/uL   nRBC 0.0 0.0 - 0.2 %   ____________________________________________  EKG My review and personal interpretation at Time: 13:00   Indication: cough  Rate: 100  Rhythm: sinus Axis: normal Other: poor r wave progression ____________________________________________  RADIOLOGY  I personally reviewed all radiographic images ordered to evaluate for the above acute complaints and reviewed radiology reports and findings.  These findings were personally discussed with the patient.  Please see medical record for radiology report.  ____________________________________________   PROCEDURES  Procedure(s) performed:  Procedures    Critical Care performed: no ____________________________________________   INITIAL IMPRESSION / ASSESSMENT AND PLAN / ED COURSE  Pertinent labs & imaging results that were available during my care of the patient were reviewed by me and considered in my medical decision making (see chart for details).   DDX: Pneumonia, dehydration, electrolyte abnormality, COVID-19, anemia  Elizabeth Castillo is a 85 y.o. who presents to the ED with symptoms as described above.  Patient currently afebrile hemodynamically stable.  Exam and work-up is concerning for committee acquired pneumonia.  Patient require hospitalization based on age and risk factors.  Does have low-grade temp.  Will order blood cultures.  Patient will be signed out oncoming physician pending follow-up COVID-19 evaluate the this more consistent with community-acquired.     The patient was evaluated in Emergency Department today for the symptoms described in the history of present  illness. He/she was evaluated in the context of the global COVID-19 pandemic, which necessitated consideration that the patient might be at risk for infection with the SARS-CoV-2 virus that causes COVID-19. Institutional protocols and algorithms that pertain to the evaluation of patients at risk for COVID-19 are in a state of rapid change based on information released by regulatory bodies including the CDC and federal and state organizations. These policies and algorithms were followed during the patient's care in the ED.  As part of my medical decision making, I reviewed the following data within the [place] notes reviewed and incorporated, Labs reviewed, notes from prior ED visits and [place] Controlled Substance Database   ____________________________________________   FINAL CLINICAL IMPRESSION(S) / ED DIAGNOSES  Final diagnoses:  Cough      NEW MEDICATIONS STARTED DURING THIS VISIT:  New Prescriptions   No medications on file     Note:  This document was prepared using Dragon voice recognition software and may include  unintentional dictation errors.    Merlyn Lot, MD 10/13/18 [phone]

## 2018-10-13 NOTE — ED Notes (Signed)
[facility]  CALLED  FOR  TRANSFER 

## 2018-10-13 NOTE — ED Provider Notes (Addendum)
3:52 PM Assumed care for off going team.   Blood pressure (!) 133/49, pulse 78, temperature 99.5 F (37.5 C), temperature source Oral, resp. rate 20, height 5\' 3"  (1.6 m), weight 63.5 kg, SpO2 97 %.  See their HPI for full report but in brief Pt with PNA on RLL.  Plan to admit.  F/u on Covid.   Covid +   5:02 PM will d/w [facility] given patient lives alone and has some increased work of breathing with ambulating.  D/w Dr. [place] Pink and pt accepted.   Patient is now wanting to leave.  They understand they are leaving [place].  I had a lengthy discussion with patient and patient's son.  They understand the risk including death and permanent disability.  They have the capacity make this decision.  They understand that if her symptoms are to worsen that she needs to return the emergency room immediately if she would like further care.  I discussed with Esmond Plants if steroids can be given given we were going to hospitalize her.  They recommended giving 6 mg of Decadron daily for 5 days given no history of diabetes.  No antibiotics given no white count and procalcitonin is negative.  They understands that if they decide they would want further care that they should return to the ER.       Vanessa Kyle, MD 10/13/18 2008

## 2018-10-13 NOTE — ED Notes (Signed)
Son at bedside. Pt and son st they would like to be DC refusing transfer at this time. MD Nickolas Madrid notified.

## 2018-10-13 NOTE — ED Triage Notes (Signed)
Patient and family report patient has had increased fatigue and weakness x1 week. Also reports chills and tremors, however denies fever. Patient states "I just don't feel good." Patient's son states she has had an intermittent cough. Also reports decreased appetite.

## 2018-10-13 NOTE — ED Notes (Signed)
Patient denies pain and is resting comfortably.  

## 2018-10-13 NOTE — Discharge Instructions (Addendum)
We recommended admission for her coronavirus.  I discussed with the [facility] team.  We will try some steroids to see if that helps her symptoms.  If she develops worsening shortness of breath she should return immediately to the emergency room.  You are leaving [place].     Person Under Monitoring Name: Elizabeth Castillo  Location: [address] [place] [address]   Infection Prevention Recommendations for Individuals Confirmed to have, or Being Evaluated for, 2019 Novel Coronavirus (COVID-19) Infection Who Receive Care at Home  Individuals who are confirmed to have, or are being evaluated for, COVID-19 should follow the prevention steps below until a healthcare provider or local or state health department says they can return to normal activities.  Stay home except to get medical care You should restrict activities outside your home, except for getting medical care. Do not go to work, school, or public areas, and do not use public transportation or taxis.  Call ahead before visiting your doctor Before your medical appointment, call the healthcare provider and tell them that you have, or are being evaluated for, COVID-19 infection. This will help the healthcare providers office take steps to keep other people from getting infected. Ask your healthcare provider to call the local or state health department.  Monitor your symptoms Seek prompt medical attention if your illness is worsening (e.g., difficulty breathing). Before going to your medical appointment, call the healthcare provider and tell them that you have, or are being evaluated for, COVID-19 infection. Ask your healthcare provider to call the local or state health department.  Wear a facemask You should wear a facemask that covers your nose and mouth when you are in the same room with other people and when you visit a healthcare provider. People who live with or visit you should also wear a facemask  while they are in the same room with you.  Separate yourself from other people in your home As much as possible, you should stay in a different room from other people in your home. Also, you should use a separate bathroom, if available.  Avoid sharing household items You should not share dishes, drinking glasses, cups, eating utensils, towels, bedding, or other items with other people in your home. After using these items, you should wash them thoroughly with soap and water.  Cover your coughs and sneezes Cover your mouth and nose with a tissue when you cough or sneeze, or you can cough or sneeze into your sleeve. Throw used tissues in a lined trash can, and immediately wash your hands with soap and water for at least 20 seconds or use an alcohol-based hand rub.  Wash your [facility] your hands often and thoroughly with soap and water for at least 20 seconds. You can use an alcohol-based hand sanitizer if soap and water are not available and if your hands are not visibly dirty. Avoid touching your eyes, nose, and mouth with unwashed hands.   Prevention Steps for Caregivers and Household Members of Individuals Confirmed to have, or Being Evaluated for, COVID-19 Infection Being Cared for in the Home  If you live with, or provide care at home for, a person confirmed to have, or being evaluated for, COVID-19 infection please follow these guidelines to prevent infection:  Follow healthcare providers instructions Make sure that you understand and can help the patient follow any healthcare provider instructions for all care.  Provide for the patients basic needs You should help the patient with basic  needs in the home and provide support for getting groceries, prescriptions, and other personal needs.  Monitor the patients symptoms If they are getting sicker, call his or her medical provider and tell them that the patient has, or is being evaluated for, COVID-19 infection. This will  help the healthcare providers office take steps to keep other people from getting infected. Ask the healthcare provider to call the local or state health department.  Limit the number of people who have contact with the patient If possible, have only one caregiver for the patient. Other household members should stay in another home or place of residence. If this is not possible, they should stay in another room, or be separated from the patient as much as possible. Use a separate bathroom, if available. Restrict visitors who do not have an essential need to be in the home.  Keep older adults, very young children, and other sick people away from the patient Keep older adults, very young children, and those who have compromised immune systems or chronic health conditions away from the patient. This includes people with chronic heart, lung, or kidney conditions, diabetes, and cancer.  Ensure good ventilation Make sure that shared spaces in the home have good air flow, such as from an air conditioner or an opened window, weather permitting.  Wash your hands often Wash your hands often and thoroughly with soap and water for at least 20 seconds. You can use an alcohol based hand sanitizer if soap and water are not available and if your hands are not visibly dirty. Avoid touching your eyes, nose, and mouth with unwashed hands. Use disposable paper towels to dry your hands. If not available, use dedicated cloth towels and replace them when they become wet.  Wear a facemask and gloves Wear a disposable facemask at all times in the room and gloves when you touch or have contact with the patients blood, body fluids, and/or secretions or excretions, such as sweat, saliva, sputum, nasal mucus, vomit, urine, or feces.  Ensure the mask fits over your nose and mouth tightly, and do not touch it during use. Throw out disposable facemasks and gloves after using them. Do not reuse. Wash your hands immediately  after removing your facemask and gloves. If your personal clothing becomes contaminated, carefully remove clothing and launder. Wash your hands after handling contaminated clothing. Place all used disposable facemasks, gloves, and other waste in a lined container before disposing them with other household waste. Remove gloves and wash your hands immediately after handling these items.  Do not share dishes, glasses, or other household items with the patient Avoid sharing household items. You should not share dishes, drinking glasses, cups, eating utensils, towels, bedding, or other items with a patient who is confirmed to have, or being evaluated for, COVID-19 infection. After the person uses these items, you should wash them thoroughly with soap and water.  Wash laundry thoroughly Immediately remove and wash clothes or bedding that have blood, body fluids, and/or secretions or excretions, such as sweat, saliva, sputum, nasal mucus, vomit, urine, or feces, on them. Wear gloves when handling laundry from the patient. Read and follow directions on labels of laundry or clothing items and detergent. In general, wash and dry with the warmest temperatures recommended on the label.  Clean all areas the individual has used often Clean all touchable surfaces, such as counters, tabletops, doorknobs, bathroom fixtures, toilets, phones, keyboards, tablets, and bedside tables, every day. Also, clean any surfaces that may have blood,  body fluids, and/or secretions or excretions on them. Wear gloves when cleaning surfaces the patient has come in contact with. Use a diluted bleach solution (e.g., dilute bleach with 1 part bleach and 10 parts water) or a household disinfectant with a label that says EPA-registered for coronaviruses. To make a bleach solution at home, add 1 tablespoon of bleach to 1 quart (4 cups) of water. For a larger supply, add  cup of bleach to 1 gallon (16 cups) of water. Read labels of  cleaning products and follow recommendations provided on product labels. Labels contain instructions for safe and effective use of the cleaning product including precautions you should take when applying the product, such as wearing gloves or eye protection and making sure you have good ventilation during use of the product. Remove gloves and wash hands immediately after cleaning.  Monitor yourself for signs and symptoms of illness Caregivers and household members are considered close contacts, should monitor their health, and will be asked to limit movement outside of the home to the extent possible. Follow the monitoring steps for close contacts listed on the symptom monitoring form.   ? If you have additional questions, contact your local health department or call the epidemiologist on call at [phone] (available 24/7). ? This guidance is subject to change. For the most up-to-date guidance from [facility], please refer to their website: YouBlogs.pl

## 2018-10-13 NOTE — ED Notes (Signed)
Pt stating that she doesn't want to go to [facility] and wants to go home. MD Jari Pigg and this RN talked to pt and her son about the risks of going home and informed them that it would be against medical advice.

## 2018-10-18 LAB — CULTURE, BLOOD (ROUTINE X 2)
Culture: NO GROWTH
Culture: NO GROWTH

## 2019-12-19 ENCOUNTER — Emergency Department
Admission: EM | Admit: 2019-12-19 | Discharge: 2019-12-19 | Disposition: A | Discharge status: Home / Self Care | Payer: Medicare Other | Attending: Emergency Medicine | Admitting: Emergency Medicine

## 2019-12-19 ENCOUNTER — Emergency Department: Payer: Medicare Other

## 2019-12-19 DIAGNOSIS — Z85828 Personal history of other malignant neoplasm of skin: Secondary | ICD-10-CM | POA: Diagnosis not present

## 2019-12-19 DIAGNOSIS — Y92510 Bank as the place of occurrence of the external cause: Secondary | ICD-10-CM | POA: Diagnosis not present

## 2019-12-19 DIAGNOSIS — S0990XA Unspecified injury of head, initial encounter: Secondary | ICD-10-CM

## 2019-12-19 DIAGNOSIS — Z79899 Other long term (current) drug therapy: Secondary | ICD-10-CM | POA: Insufficient documentation

## 2019-12-19 DIAGNOSIS — I10 Essential (primary) hypertension: Secondary | ICD-10-CM | POA: Diagnosis not present

## 2019-12-19 DIAGNOSIS — W108XXA Fall (on) (from) other stairs and steps, initial encounter: Secondary | ICD-10-CM | POA: Diagnosis not present

## 2019-12-19 DIAGNOSIS — S0101XA Laceration without foreign body of scalp, initial encounter: Secondary | ICD-10-CM | POA: Insufficient documentation

## 2019-12-19 LAB — COMPREHENSIVE METABOLIC PANEL
ALT: 7 U/L (ref 0–44)
AST: 17 U/L (ref 15–41)
Albumin: 3.9 g/dL (ref 3.5–5.0)
Alkaline Phosphatase: 97 U/L (ref 38–126)
Anion gap: 10 (ref 5–15)
BUN: 34 mg/dL — ABNORMAL HIGH (ref 8–23)
CO2: 18 mmol/L — ABNORMAL LOW (ref 22–32)
Calcium: 9.2 mg/dL (ref 8.9–10.3)
Chloride: 110 mmol/L (ref 98–111)
Creatinine, Ser: 1.59 mg/dL — ABNORMAL HIGH (ref 0.44–1.00)
GFR, Estimated: 30 mL/min — ABNORMAL LOW (ref >60)
Glucose, Bld: 117 mg/dL — ABNORMAL HIGH (ref 70–99)
Potassium: 5.2 mmol/L — ABNORMAL HIGH (ref 3.5–5.1)
Sodium: 138 mmol/L (ref 135–145)
Total Bilirubin: 0.9 mg/dL (ref 0.3–1.2)
Total Protein: 7.1 g/dL (ref 6.5–8.1)

## 2019-12-19 LAB — CBC
HCT: 34.2 % — ABNORMAL LOW (ref 36.0–46.0)
Hemoglobin: 10.6 g/dL — ABNORMAL LOW (ref 12.0–15.0)
MCH: 28.3 pg (ref 26.0–34.0)
MCHC: 31 g/dL (ref 30.0–36.0)
MCV: 91.4 fL (ref 80.0–100.0)
Platelets: 269 10*3/uL (ref 150–400)
RBC: 3.74 MIL/uL — ABNORMAL LOW (ref 3.87–5.11)
RDW: 14.7 % (ref 11.5–15.5)
WBC: 6.8 10*3/uL (ref 4.0–10.5)
nRBC: 0 % (ref 0.0–0.2)

## 2019-12-19 NOTE — ED Triage Notes (Addendum)
Pt to ED via [facility] from bank. Per EMS pt had a witness fall by bystanders. EMS stating pt was walking up the stairs when she lost her footing and fell backwards onto concrete. No LOC. Pt with abrasions to left and right arm and a laceration left posterior head. Pt with hx HTN. No known blood thinner use.   Upon arrival pt shaking and not answering this RNs questions. EMS stating pt really anxious in route.

## 2019-12-19 NOTE — Discharge Instructions (Addendum)
Return to the ER in 7 days for removal of the staples.  Return immediately for new or worsening severe headache, bleeding, confusion, vomiting, or any other new or worsening symptoms that concern you.

## 2019-12-19 NOTE — ED Provider Notes (Signed)
[facility] Emergency Department Provider Note ____________________________________________   First MD Initiated Contact with Patient 12/19/19 1326     (approximate)  I have reviewed the triage vital signs and the nursing notes.   HISTORY  Chief Complaint Fall    HPI Elizabeth Castillo is a 86 y.o. female with PMH as noted below who presents with a head injury after a fall from standing height.  Per the patient's son, the patient lost her balance or slipped while climbing some steps at a bank.  She fell backwards and hit her head.  She did not lose consciousness.  She has a laceration to the back of her head and some scattered skin tears on her extremities, but denies any other injuries.  She states she feels fine and would like to go home.  Past Medical History:  Diagnosis Date  . Cancer ([place])    skin cancer  . Hypertension     There are no problems to display for this patient.   Past Surgical History:  Procedure Laterality Date  . ABDOMINAL HYSTERECTOMY      Prior to Admission medications   Medication Sig Start Date End Date Taking? Authorizing Provider  acetaminophen (TYLENOL) 500 MG tablet Take 1,000 mg by mouth every 6 (six) hours as needed.    [provider]  allopurinol (ZYLOPRIM) 100 MG tablet Take 100 mg by mouth daily. 04/21/18   [provider]  amLODipine (NORVASC) 10 MG tablet Take 10 mg by mouth daily. 10/13/18   [provider]  lisinopril (ZESTRIL) 30 MG tablet Take 30 mg by mouth daily. 10/13/18   [provider]  omeprazole (PRILOSEC) 20 MG capsule Take 20 mg by mouth daily. 10/13/18   [provider]  sertraline (ZOLOFT) 100 MG tablet Take 100 mg by mouth daily. 10/13/18   [provider]    Allergies Patient has no known allergies.  No family history on file.  Social History Social History   Tobacco Use  . Smoking status: Never Smoker  . Smokeless tobacco: Never Used   Substance Use Topics  . Alcohol use: No  . Drug use: Not on file    Review of Systems  Constitutional: No fever. Eyes: No redness. ENT: No neck pain. Cardiovascular: Denies chest pain. Respiratory: Denies shortness of breath. Gastrointestinal: No vomiting. Genitourinary: Negative for flank pain. Musculoskeletal: Negative for back pain. Skin: Negative for rash. Neurological: Negative for headache.   ____________________________________________   PHYSICAL EXAM:  VITAL SIGNS: ED Triage Vitals [12/19/19 1322]  Enc Vitals Group     BP (!) 145/93     Pulse Rate 88     Resp (!) 22     Temp 97.9 F (36.6 C)     Temp Source Oral     SpO2 96 %     Weight      Height      Head Circumference      Peak Flow      Pain Score      Pain Loc      Pain Edu?      Excl. in [place]?     Constitutional: Alert and oriented.  Frail but well appearing for her age, in no acute distress. Eyes: Conjunctivae are normal.  EOMI. Head: 2 adjacent 1 to 3 cm superficial lacerations to the left parieto-occipital scalp. Nose: No congestion/rhinnorhea. Mouth/Throat: Mucous membranes are moist.   Neck: Normal range of motion.  No midline cervical spinal tenderness. Cardiovascular: Normal  rate, regular rhythm. Good peripheral circulation. Respiratory: Normal respiratory effort.  No retractions.  Gastrointestinal: Soft and nontender. No distention.  Genitourinary: No CVA tenderness. Musculoskeletal: No lower extremity edema.  Extremities warm and well perfused.  Full range of motion to bilateral upper and lower extremities.  No midline thoracic or lumbar spinal tenderness. Neurologic:  Normal speech and language.  5/5 motor strength and intact sensation to all extremities.  Normal coordination.  No facial droop. Skin:  Skin is warm and dry. No rash noted. Psychiatric: Somewhat anxious appearing. Speech and behavior are normal.  ____________________________________________   LABS (all labs ordered  are listed, but only abnormal results are displayed)  Labs Reviewed  COMPREHENSIVE METABOLIC PANEL - Abnormal; Notable for the following components:      Result Value   Potassium 5.2 (*)    CO2 18 (*)    Glucose, Bld 117 (*)    BUN 34 (*)    Creatinine, Ser 1.59 (*)    GFR, Estimated 30 (*)    All other components within normal limits  CBC - Abnormal; Notable for the following components:   RBC 3.74 (*)    Hemoglobin 10.6 (*)    HCT 34.2 (*)    All other components within normal limits   ____________________________________________  EKG   ____________________________________________  RADIOLOGY  CT head: No skull fracture ICH, or other acute intracranial finding.  ____________________________________________   PROCEDURES  Procedure(s) performed: Yes  .Marland KitchenLaceration Repair  Date/Time: 12/19/2019 3:37 PM Performed by: Arta Silence, MD Authorized by: Arta Silence, MD   Consent:    Consent obtained:  Verbal   Consent given by:  Patient   Risks discussed:  Infection, pain, retained foreign body, poor cosmetic result and poor wound healing Anesthesia (see MAR for exact dosages):    Anesthesia method:  Local infiltration   Local anesthetic:  Lidocaine 2% WITH epi Laceration details:    Location:  Scalp   Scalp location:  L parietal   Length (cm):  6   Depth (mm):  3 Repair type:    Repair type:  Simple Exploration:    Hemostasis achieved with:  Direct pressure   Wound exploration: entire depth of wound probed and visualized     Wound extent: no foreign bodies/material noted     Contaminated: no   Treatment:    Area cleansed with:  Saline and Betadine   Amount of cleaning:  Extensive   Irrigation solution:  Sterile saline   Visualized foreign bodies/material removed: no   Skin repair:    Repair method:  Staples   Number of staples:  14 Approximation:    Approximation:  Close Post-procedure details:    Dressing:  Open (no dressing)   Patient  tolerance of procedure:  Tolerated well, no immediate complications    Critical Care performed: No ____________________________________________   INITIAL IMPRESSION / ASSESSMENT AND PLAN / ED COURSE  Pertinent labs & imaging results that were available during my care of the patient were reviewed by me and considered in my medical decision making (see chart for details).  86 year old female with PMH as noted above presents with a head injury after a mechanical fall from standing height.  The patient has a laceration to the back of her head and a few scattered skin tears on her extremities but denies other injuries.  On initial exam, the patient appeared somewhat anxious, was shaking, and stating that she felt fine just wanted to go home.  Once her son arrived, the  patient became more calm and cooperative.  Her vital signs are normal.  She is alert and oriented.  She has 2 adjacent lacerations to the back of her head and some skin tears but no other traumatic findings.  Neurologic exam is nonfocal.  She has good range of motion to all extremities and no midline spinal tenderness.  The patient agreed to a CT of the head to rule out intracranial injury.  She does not require any imaging of the spine or extremities.  Basic labs were ordered when the patient was triaged.  ----------------------------------------- 3:39 PM on 12/19/2019 -----------------------------------------  CT head shows no acute intracranial findings.  I repaired the lacerations with staples.  The patient tolerated this well.  The lab work-up is unremarkable with creatinine and hemoglobin actually improving from prior labs.  The patient continues to appear comfortable.  She states she would like to go home.  At this time, she is stable for discharge.  Return precautions given, and she and the son expressed understanding.  ____________________________________________   FINAL CLINICAL IMPRESSION(S) / ED DIAGNOSES  Final  diagnoses:  Laceration of scalp, initial encounter  Minor head injury, initial encounter      NEW MEDICATIONS STARTED DURING THIS VISIT:  Discharge Medication List as of 12/19/2019  3:10 PM       Note:  This document was prepared using Dragon voice recognition software and may include unintentional dictation errors.   Arta Silence, MD 12/19/19 1540

## 2019-12-26 ENCOUNTER — Other Ambulatory Visit: Payer: Self-pay

## 2019-12-26 ENCOUNTER — Encounter: Payer: Self-pay | Admitting: Emergency Medicine

## 2019-12-26 ENCOUNTER — Emergency Department
Admission: EM | Admit: 2019-12-26 | Discharge: 2019-12-26 | Disposition: A | Discharge status: Home / Self Care | Payer: Medicare Other | Attending: Emergency Medicine | Admitting: Emergency Medicine

## 2019-12-26 ENCOUNTER — Emergency Department: Payer: Medicare Other

## 2019-12-26 DIAGNOSIS — Z79899 Other long term (current) drug therapy: Secondary | ICD-10-CM | POA: Insufficient documentation

## 2019-12-26 DIAGNOSIS — S0101XD Laceration without foreign body of scalp, subsequent encounter: Secondary | ICD-10-CM | POA: Diagnosis not present

## 2019-12-26 DIAGNOSIS — I10 Essential (primary) hypertension: Secondary | ICD-10-CM | POA: Insufficient documentation

## 2019-12-26 DIAGNOSIS — S20211A Contusion of right front wall of thorax, initial encounter: Secondary | ICD-10-CM

## 2019-12-26 DIAGNOSIS — Z85828 Personal history of other malignant neoplasm of skin: Secondary | ICD-10-CM | POA: Insufficient documentation

## 2019-12-26 DIAGNOSIS — Z4802 Encounter for removal of sutures: Secondary | ICD-10-CM | POA: Diagnosis present

## 2019-12-26 DIAGNOSIS — X58XXXA Exposure to other specified factors, initial encounter: Secondary | ICD-10-CM | POA: Diagnosis not present

## 2019-12-26 NOTE — ED Provider Notes (Signed)
[facility] Emergency Department Provider Note  ____________________________________________   First MD Initiated Contact with Patient 12/26/19 1046     (approximate)  I have reviewed the triage vital signs and the nursing notes.   HISTORY  Chief Complaint Suture / Staple Removal   HPI Elizabeth Castillo is a 86 y.o. female brought to the emergency department for staple removal.  Patient was brought by her son who states that she has not had any problems.  Patient has not washed her hair since this happened and there is a lot of dried blood in the area.  Son states that patient has complained of her right ribs hurting since she was seen in the ED.  Patient had a CT head at the time of her injury but no other x-rays were noted in her chart.      Past Medical History:  Diagnosis Date   Cancer ([place])    skin cancer   Hypertension     There are no problems to display for this patient.   Past Surgical History:  Procedure Laterality Date   ABDOMINAL HYSTERECTOMY      Prior to Admission medications   Medication Sig Start Date End Date Taking? Authorizing Provider  acetaminophen (TYLENOL) 500 MG tablet Take 1,000 mg by mouth every 6 (six) hours as needed.    [provider]  allopurinol (ZYLOPRIM) 100 MG tablet Take 100 mg by mouth daily. 04/21/18   [provider]  amLODipine (NORVASC) 10 MG tablet Take 10 mg by mouth daily. 10/13/18   [provider]  lisinopril (ZESTRIL) 30 MG tablet Take 30 mg by mouth daily. 10/13/18   [provider]  omeprazole (PRILOSEC) 20 MG capsule Take 20 mg by mouth daily. 10/13/18   [provider]  sertraline (ZOLOFT) 100 MG tablet Take 100 mg by mouth daily. 10/13/18   [provider]    Allergies Patient has no known allergies.  No family history on file.  Social History Social History   Tobacco Use   Smoking status: Never Smoker   Smokeless tobacco: Never  Used  Substance Use Topics   Alcohol use: No   Drug use: Not on file    Review of Systems Constitutional: No fever/chills Cardiovascular: Denies chest pain. Respiratory: Denies shortness of breath. Gastrointestinal: No abdominal pain.  No nausea, no vomiting.  Musculoskeletal: Positive for right rib pain. Skin: Positive for staples. Neurological: Negative for headaches, focal weakness or numbness. ____________________________________________   PHYSICAL EXAM:  VITAL SIGNS: ED Triage Vitals  Enc Vitals Group     BP 12/26/19 1017 (!) 149/45     Pulse Rate 12/26/19 1017 76     Resp 12/26/19 1017 16     Temp 12/26/19 1017 98.7 F (37.1 C)     Temp Source 12/26/19 1017 Oral     SpO2 12/26/19 1017 100 %     Weight 12/26/19 1014 139 lb 15.9 oz (63.5 kg)     Height 12/26/19 1014 5\' 3"  (1.6 m)     Head Circumference --      Peak Flow --      Pain Score 12/26/19 1014 0     Pain Loc --      Pain Edu? --      Excl. in [place]? --     Constitutional: Alert and oriented. Well appearing and in no acute distress. Eyes: Conjunctivae are normal.  Head: Atraumatic. Neck: No stridor.   Cardiovascular: Normal rate, regular rhythm.  Grossly normal heart sounds.  Good peripheral circulation. Respiratory: Normal respiratory effort.  No retractions. Lungs CTAB. Musculoskeletal: Moves upper and lower extremities but presently is sitting in a wheelchair. Neurologic:  Normal speech and language. No gross focal neurologic deficits are appreciated. No gait instability. Skin:  Skin is warm, dry and intact.  Staples are present and area appears to be healing well without any signs of infection. Psychiatric: Mood and affect are normal. Speech and behavior are normal.  ____________________________________________   LABS (all labs ordered are listed, but only abnormal results are displayed)  Labs Reviewed - No data to display  RADIOLOGY I, Johnn Hai, personally viewed and evaluated these  images (plain radiographs) as part of my medical decision making, as well as reviewing the written report by the radiologist.   Official radiology report(s): DG Ribs Unilateral W/Chest Right  Result Date: 12/26/2019 CLINICAL DATA:  Right rib pain, fall EXAM: RIGHT RIBS AND CHEST - 3+ VIEW COMPARISON:  10/13/2018 chest radiograph. FINDINGS: Stable cardiomediastinal silhouette with normal heart size. No pneumothorax. No pleural effusion. Lungs appear clear, with no acute consolidative airspace disease and no pulmonary edema. No fracture or focal osseous lesion detected in the right ribs. IMPRESSION: No active cardiopulmonary disease. No right rib fracture detected. Should the patient's symptoms persist or worsen, repeat radiographs of the ribs in 10 - 14 days maybe of use to detect subtle nondisplaced rib fractures (which are commonly occult on initial imaging). Electronically Signed   By: Ilona Sorrel M.D.   On: 12/26/2019 12:54    ____________________________________________   PROCEDURES  Procedure(s) performed (including Critical Care):  Procedures Staples were removed by provider for this patient.  ____________________________________________   INITIAL IMPRESSION / ASSESSMENT AND PLAN / ED COURSE  As part of my medical decision making, I reviewed the following data within the electronic MEDICAL RECORD NUMBER Notes from prior ED visits and [place] Controlled Substance Database  86 year old female is brought to the ED by her son for staple removal of a previous scalp laceration.  Patient prior to discharge complained of her right ribs that was not made aware to staff or myself.  X-ray was negative for fracture.  Staples were removed by provider without any difficulty.  Patient is to follow-up with her PCP if any continued problems or concerns.  ____________________________________________   FINAL CLINICAL IMPRESSION(S) / ED DIAGNOSES  Final diagnoses:  Encounter for staple removal  Rib  contusion, right, initial encounter     ED Discharge Orders    None      *Please note:  Elizabeth Castillo was evaluated in Emergency Department on 12/26/2019 for the symptoms described in the history of present illness. She was evaluated in the context of the global COVID-19 pandemic, which necessitated consideration that the patient might be at risk for infection with the SARS-CoV-2 virus that causes COVID-19. Institutional protocols and algorithms that pertain to the evaluation of patients at risk for COVID-19 are in a state of rapid change based on information released by regulatory bodies including the CDC and federal and state organizations. These policies and algorithms were followed during the patient's care in the ED.  Some ED evaluations and interventions may be delayed as a result of limited staffing during and the pandemic.*   Note:  This document was prepared using Dragon voice recognition software and may include unintentional dictation errors.    Johnn Hai, PA-C 12/26/19 1538    Lavonia Drafts, MD 12/27/19 [phone]

## 2019-12-26 NOTE — ED Triage Notes (Signed)
Staple removal

## 2019-12-26 NOTE — Discharge Instructions (Addendum)
Follow-up with your primary care provider if any continued problems.  Staples were removed from your head today and area appears to be healing without any signs of infection.  You may also wash your hair today but did not let it stay wet.  Try to dry immediately.  Continue with your regular medications as prescribed by your doctor.

## 2020-01-17 ENCOUNTER — Emergency Department: Payer: Medicare Other

## 2020-01-17 ENCOUNTER — Other Ambulatory Visit: Payer: Self-pay

## 2020-01-17 ENCOUNTER — Observation Stay: Payer: Medicare Other

## 2020-01-17 ENCOUNTER — Encounter: Payer: Self-pay | Admitting: Emergency Medicine

## 2020-01-17 ENCOUNTER — Observation Stay
Admission: EM | Admit: 2020-01-17 | Discharge: 2020-01-18 | Disposition: A | Discharge status: Home / Self Care | Payer: Medicare Other | Attending: Internal Medicine | Admitting: Internal Medicine

## 2020-01-17 DIAGNOSIS — S065X0A Traumatic subdural hemorrhage without loss of consciousness, initial encounter: Principal | ICD-10-CM | POA: Insufficient documentation

## 2020-01-17 DIAGNOSIS — E860 Dehydration: Secondary | ICD-10-CM

## 2020-01-17 DIAGNOSIS — Z79899 Other long term (current) drug therapy: Secondary | ICD-10-CM | POA: Insufficient documentation

## 2020-01-17 DIAGNOSIS — S0990XA Unspecified injury of head, initial encounter: Secondary | ICD-10-CM | POA: Diagnosis present

## 2020-01-17 DIAGNOSIS — I10 Essential (primary) hypertension: Secondary | ICD-10-CM | POA: Diagnosis not present

## 2020-01-17 DIAGNOSIS — S065XAA Traumatic subdural hemorrhage with loss of consciousness status unknown, initial encounter: Secondary | ICD-10-CM

## 2020-01-17 DIAGNOSIS — W1839XA Other fall on same level, initial encounter: Secondary | ICD-10-CM | POA: Insufficient documentation

## 2020-01-17 DIAGNOSIS — Z20822 Contact with and (suspected) exposure to covid-19: Secondary | ICD-10-CM | POA: Insufficient documentation

## 2020-01-17 DIAGNOSIS — K5792 Diverticulitis of intestine, part unspecified, without perforation or abscess without bleeding: Secondary | ICD-10-CM | POA: Diagnosis not present

## 2020-01-17 DIAGNOSIS — Z85820 Personal history of malignant melanoma of skin: Secondary | ICD-10-CM | POA: Insufficient documentation

## 2020-01-17 DIAGNOSIS — A419 Sepsis, unspecified organism: Secondary | ICD-10-CM

## 2020-01-17 DIAGNOSIS — R6520 Severe sepsis without septic shock: Secondary | ICD-10-CM

## 2020-01-17 LAB — BASIC METABOLIC PANEL
Anion gap: 9 (ref 5–15)
BUN: 32 mg/dL — ABNORMAL HIGH (ref 8–23)
CO2: 19 mmol/L — ABNORMAL LOW (ref 22–32)
Calcium: 8.9 mg/dL (ref 8.9–10.3)
Chloride: 113 mmol/L — ABNORMAL HIGH (ref 98–111)
Creatinine, Ser: 1.68 mg/dL — ABNORMAL HIGH (ref 0.44–1.00)
GFR, Estimated: 28 mL/min — ABNORMAL LOW (ref >60)
Glucose, Bld: 179 mg/dL — ABNORMAL HIGH (ref 70–99)
Potassium: 4.7 mmol/L (ref 3.5–5.1)
Sodium: 141 mmol/L (ref 135–145)

## 2020-01-17 LAB — URINALYSIS, COMPLETE (UACMP) WITH MICROSCOPIC
Bacteria, UA: NONE SEEN
Bilirubin Urine: NEGATIVE
Glucose, UA: NEGATIVE mg/dL
Hgb urine dipstick: NEGATIVE
Ketones, ur: NEGATIVE mg/dL
Leukocytes,Ua: NEGATIVE
Nitrite: NEGATIVE
Protein, ur: 100 mg/dL — AB
Specific Gravity, Urine: 1.013 (ref 1.005–1.030)
pH: 5 (ref 5.0–8.0)

## 2020-01-17 LAB — CBC
HCT: 36.2 % (ref 36.0–46.0)
Hemoglobin: 10.9 g/dL — ABNORMAL LOW (ref 12.0–15.0)
MCH: 28 pg (ref 26.0–34.0)
MCHC: 30.1 g/dL (ref 30.0–36.0)
MCV: 93.1 fL (ref 80.0–100.0)
Platelets: 313 10*3/uL (ref 150–400)
RBC: 3.89 MIL/uL (ref 3.87–5.11)
RDW: 14.7 % (ref 11.5–15.5)
WBC: 11.8 10*3/uL — ABNORMAL HIGH (ref 4.0–10.5)
nRBC: 0 % (ref 0.0–0.2)

## 2020-01-17 LAB — RESP PANEL BY RT-PCR (FLU A&B, COVID) ARPGX2
Influenza A by PCR: NEGATIVE
Influenza B by PCR: NEGATIVE
SARS Coronavirus 2 by RT PCR: NEGATIVE

## 2020-01-17 LAB — LACTIC ACID, PLASMA
Lactic Acid, Venous: 2.1 mmol/L (ref 0.5–1.9)
Lactic Acid, Venous: 0.9 mmol/L (ref 0.5–1.9)

## 2020-01-17 LAB — HEPATIC FUNCTION PANEL
ALT: 10 U/L (ref 0–44)
AST: 18 U/L (ref 15–41)
Albumin: 3.7 g/dL (ref 3.5–5.0)
Alkaline Phosphatase: 90 U/L (ref 38–126)
Bilirubin, Direct: 0.1 mg/dL (ref 0.0–0.2)
Indirect Bilirubin: 0.3 mg/dL (ref 0.3–0.9)
Total Bilirubin: 0.4 mg/dL (ref 0.3–1.2)
Total Protein: 6.8 g/dL (ref 6.5–8.1)

## 2020-01-17 MED ORDER — SODIUM CHLORIDE 0.9 % IV SOLN: INTRAVENOUS | Status: DC

## 2020-01-17 MED ORDER — ONDANSETRON HCL 4 MG/2ML IJ SOLN
4.0000 mg | Freq: Four times a day (QID) | INTRAMUSCULAR | Status: DC | PRN
  Administered 2020-01-17: 20:00:00 4 mg via INTRAVENOUS
  Filled 2020-01-17: qty 2

## 2020-01-17 MED ORDER — ONDANSETRON HCL 4 MG PO TABS: 4.0000 mg | ORAL_TABLET | Freq: Four times a day (QID) | ORAL | Status: DC | PRN

## 2020-01-17 MED ORDER — AMLODIPINE BESYLATE 10 MG PO TABS
10.0000 mg | ORAL_TABLET | Freq: Every day | ORAL | Status: DC
  Administered 2020-01-17: 20:00:00 10 mg via ORAL
  Filled 2020-01-17: qty 2

## 2020-01-17 MED ORDER — SODIUM CHLORIDE 0.9 % IV BOLUS
1000.0000 mL | Freq: Once | INTRAVENOUS | Status: AC
  Administered 2020-01-17: 1000 mL via INTRAVENOUS

## 2020-01-17 MED ORDER — ACETAMINOPHEN 325 MG PO TABS
650.0000 mg | ORAL_TABLET | Freq: Four times a day (QID) | ORAL | Status: DC | PRN
  Administered 2020-01-17: 650 mg via ORAL
  Filled 2020-01-17: qty 2

## 2020-01-17 MED ORDER — SERTRALINE HCL 50 MG PO TABS
100.0000 mg | ORAL_TABLET | Freq: Every day | ORAL | Status: DC
  Administered 2020-01-17: 20:00:00 100 mg via ORAL
  Filled 2020-01-17: qty 2

## 2020-01-17 MED ORDER — PIPERACILLIN-TAZOBACTAM 3.375 G IVPB 30 MIN
3.3750 g | Freq: Once | INTRAVENOUS | Status: AC
  Administered 2020-01-17: 17:00:00 3.375 g via INTRAVENOUS
  Filled 2020-01-17: qty 50

## 2020-01-17 MED ORDER — LISINOPRIL 20 MG PO TABS
30.0000 mg | ORAL_TABLET | Freq: Every day | ORAL | Status: DC
  Administered 2020-01-17: 20:00:00 30 mg via ORAL
  Filled 2020-01-17: qty 3

## 2020-01-17 MED ORDER — SODIUM CHLORIDE 0.9% FLUSH: 3.0000 mL | Freq: Two times a day (BID) | INTRAVENOUS | Status: DC

## 2020-01-17 MED ORDER — ACETAMINOPHEN 650 MG RE SUPP: 650.0000 mg | Freq: Four times a day (QID) | RECTAL | Status: DC | PRN

## 2020-01-17 MED ORDER — SODIUM CHLORIDE 0.9 % IV BOLUS: 1000.0000 mL | Freq: Once | INTRAVENOUS | Status: DC

## 2020-01-17 MED ORDER — PANTOPRAZOLE SODIUM 40 MG PO TBEC
40.0000 mg | DELAYED_RELEASE_TABLET | Freq: Every day | ORAL | Status: DC
  Administered 2020-01-17: 20:00:00 40 mg via ORAL
  Filled 2020-01-17: qty 1

## 2020-01-17 NOTE — Consult Note (Signed)
CODE SEPSIS - PHARMACY COMMUNICATION  **[facility] Antibiotics should be administered within 1 hour of Sepsis diagnosis**  Time Code Sepsis Called/Page Received: 1604  Antibiotics Ordered: Zosyn 3.375g IV x1  Time of 1st antibiotic administration: 1649  Additional action taken by pharmacy: Notified nurse at 1640 for timing of antibiotics   Sherilyn Banker, PharmD Pharmacy Resident  01/17/2020 4:09 PM

## 2020-01-17 NOTE — Sepsis Progress Note (Signed)
Notified bedside nurse of need to draw repeat lactic acid and Blood Cultures.

## 2020-01-17 NOTE — Consult Note (Signed)
PHARMACY -  BRIEF ANTIBIOTIC NOTE   Pharmacy has received consult(s) for Zosyn from an ED provider.  The patient's profile has been reviewed for ht/wt/allergies/indication/available labs.    One time order(s) placed for Zosyn 3.375g IV   Further antibiotics/pharmacy consults should be ordered by admitting physician if indicated.                       Sherilyn Banker, PharmD Pharmacy Resident  01/17/2020 4:08 PM

## 2020-01-17 NOTE — Consult Note (Signed)
Referring Physician:  No referring provider defined for this encounter.  Primary Physician:  Ellamae Sia, MD  Chief Complaint:  diverticulitis  History of Present Illness: 01/17/2020 Elizabeth Castillo is a 86 y.o. female who presents with the chief complaint of slight confusion over the past few weeks since a fall around Thanksgiving.  She came in today for abdominal pain and diarrhea.    She denies headache, nausea, or vomiting.  She denies weakness.  Review of Systems:  A 10 point review of systems is negative, except for the pertinent positives and negatives detailed in the HPI.  Past Medical History: Past Medical History:  Diagnosis Date  . Cancer ([place])    skin cancer  . Hypertension     Past Surgical History: Past Surgical History:  Procedure Laterality Date  . ABDOMINAL HYSTERECTOMY      Allergies: Allergies as of 01/17/2020  . (No Known Allergies)    Medications:  Current Facility-Administered Medications:  .  0.9 %  sodium chloride infusion, , Intravenous, Continuous, Amin, Soundra Pilon, MD .  acetaminophen (TYLENOL) tablet 650 mg, 650 mg, Oral, Q6H PRN **OR** acetaminophen (TYLENOL) suppository 650 mg, 650 mg, Rectal, Q6H PRN, Lorella Nimrod, MD .  ondansetron (ZOFRAN) tablet 4 mg, 4 mg, Oral, Q6H PRN **OR** ondansetron (ZOFRAN) injection 4 mg, 4 mg, Intravenous, Q6H PRN, Amin, Sumayya, MD .  piperacillin-tazobactam (ZOSYN) IVPB 3.375 g, 3.375 g, Intravenous, Once, Duffy Bruce, MD, Last Rate: 100 mL/hr at 01/17/20 1649, 3.375 g at 01/17/20 1649 .  sodium chloride 0.9 % bolus 1,000 mL, 1,000 mL, Intravenous, Once, Duffy Bruce, MD .  sodium chloride flush (NS) 0.9 % injection 3 mL, 3 mL, Intravenous, Q12H, Lorella Nimrod, MD  Current Outpatient Medications:  .  acetaminophen (TYLENOL) 500 MG tablet, Take 1,000 mg by mouth every 6 (six) hours as needed., Disp: , Rfl:  .  allopurinol (ZYLOPRIM) 100 MG tablet, Take 100 mg by mouth daily., Disp: , Rfl:  .   amLODipine (NORVASC) 10 MG tablet, Take 10 mg by mouth daily., Disp: , Rfl:  .  lisinopril (ZESTRIL) 30 MG tablet, Take 30 mg by mouth daily., Disp: , Rfl:  .  omeprazole (PRILOSEC) 20 MG capsule, Take 20 mg by mouth daily., Disp: , Rfl:  .  sertraline (ZOLOFT) 100 MG tablet, Take 100 mg by mouth daily., Disp: , Rfl:    Social History: Social History   Tobacco Use  . Smoking status: Never Smoker  . Smokeless tobacco: Never Used  Substance Use Topics  . Alcohol use: No    Family Medical History: History reviewed. No pertinent family history.  Physical Examination: Vitals:   01/17/20 1326 01/17/20 1600  BP: (!) 148/61 (!) 159/45  Pulse: (!) 102 81  Resp: (!) 22 (!) 25  Temp: 98.2 F (36.8 C)   SpO2: 100% 100%     General: Patient is thin, well nourished, calm, collected, and in no apparent distress.  Psychiatric: Patient is non-anxious.  Head:  Pupils equal, round, and reactive to light.  ENT:  Oral mucosa appears well hydrated. She is very hard of hearing  Neck:   Supple.  Full range of motion.  Respiratory: Patient is breathing without any difficulty.  Extremities: No edema.  Vascular: Palpable pulses in dorsal pedal vessels.  Skin:   On exposed skin, there are no abnormal skin lesions.  NEUROLOGICAL:  General: In no acute distress.   Awake, alert, oriented to person, place.  Pupils equal round and reactive to  light.  Facial tone is symmetric.  Tongue protrusion is midline.  There is no pronator drift.   Strength: Side Biceps Triceps Deltoid Interossei Grip Wrist Ext. Wrist Flex.  R 5 5 5 5 5 5 5   L 5 5 5 5 5 5 5    Side Iliopsoas Quads Hamstring PF DF EHL  R 5 5 5 5 5 5   L 5 5 5 5 5 5    Reflexes are 2+ and symmetric at the biceps, triceps, brachioradialis, patella and achilles.   Bilateral upper and lower extremity sensation is intact to light touch and pin prick.  Gait is untested. Hoffman's is absent.  Imaging: CT Head  01/17/2020 IMPRESSION: Subacute to chronic left hemispheric subdural hematoma measuring 8 mm in greatest thickness. Slight mass effect upon the underlying left cerebral hemisphere without midline shift.  No evidence of acute infarction.  Stable mild-to-moderate cerebral atrophy and mild chronic small vessel ischemic disease.  Right mastoid effusion.  Electronically Signed: By: Kellie Simmering DO On: 01/17/2020 14:57  I have personally reviewed the images and agree with the above interpretation.  Labs: CBC Latest Ref Rng & Units 01/17/2020 12/19/2019 10/13/2018  WBC 4.0 - 10.5 K/uL 11.8(H) 6.8 4.7  Hemoglobin 12.0 - 15.0 g/dL 10.9(L) 10.6(L) 9.7(L)  Hematocrit 36.0 - 46.0 % 36.2 34.2(L) 32.1(L)  Platelets 150 - 400 K/uL 313 269 196       Assessment and Plan: Elizabeth Castillo is a pleasant 86 y.o. female with subacute subdural hematoma over the left hemisphere with mild cognitive changes but no other symptoms.    - Repeat HCT in 6 hours - no AEDs given age - hold therapeutic anticoagulation, though DVT PPX is ok - will follow up as outpatient   Casi Westerfeld K. Izora Ribas MD, [place] Dept. of Neurosurgery

## 2020-01-17 NOTE — H&P (Addendum)
History and Physical    ASEES MANFREDI EVO:[id number] DOB: 1923-11-28 DOA: 01/17/2020  PCP: Ellamae Sia, MD   Patient coming from: Home  I have personally briefly reviewed patient's old medical records in [place]  Chief Complaint: Abdominal pain and diarrhea.  HPI: Elizabeth Castillo is a 86 y.o. female with medical history significant of hypertension, skin cancer came to ED with complaints of generalized weakness, abdominal pain and diarrhea. Patient was having abdominal pain since this morning, went out to eat in a restaurant with her son where she started diarrhea, multiple episodes in a short duration of time with increased weakness and lethargy.  She came in covered with feces.  No history of fecal incontinence.  No nausea or vomiting.  No fever or chills.  No sick contacts.  No urinary symptoms.  Patient lives alone and independent for her ADLs.  Of note patient fell approximately a month ago, resulted in head injury with laceration, came to ED, CT head with hematoma at that time and staples were placed.  Repeat CT head today with subacute to chronic subdural hematoma. Her son since that fall she appears little off balance and at times confused. Patient denies any headaches or change in her vision.  Patient appears quite alert and oriented and wants to go back home by tomorrow to celebrate Christmas with her family.  ED Course: Patient was afebrile, mildly tachycardic and tachypneic with leukocytosis on arrival.  Lactic acid of 2.1 [place] code sepsis was called. CT abdomen with concern of diverticulitis.  CT head with subacute to chronic left hemispheric subdural hematoma with slight mass-effect and no midline shift.  CXR without any acute abnormality.  Pending blood culture, COVID-19 testing and GI panel.  Review of Systems: As per HPI otherwise 10 point review of systems negative.   Past Medical History:  Diagnosis Date   Cancer [facility])    skin cancer   Hypertension      Past Surgical History:  Procedure Laterality Date   ABDOMINAL HYSTERECTOMY       reports that she has never smoked. She has never used smokeless tobacco. She reports that she does not drink alcohol. No history on file for drug use.  No Known Allergies  History reviewed. No pertinent family history.  Prior to Admission medications   Medication Sig Start Date End Date Taking? Authorizing Provider  acetaminophen (TYLENOL) 500 MG tablet Take 1,000 mg by mouth every 6 (six) hours as needed.    [provider]  allopurinol (ZYLOPRIM) 100 MG tablet Take 100 mg by mouth daily. 04/21/18   [provider]  amLODipine (NORVASC) 10 MG tablet Take 10 mg by mouth daily. 10/13/18   [provider]  lisinopril (ZESTRIL) 30 MG tablet Take 30 mg by mouth daily. 10/13/18   [provider]  omeprazole (PRILOSEC) 20 MG capsule Take 20 mg by mouth daily. 10/13/18   [provider]  sertraline (ZOLOFT) 100 MG tablet Take 100 mg by mouth daily. 10/13/18   [provider]    Physical Exam: Vitals:   01/17/20 1326 01/17/20 1334 01/17/20 1600  BP: (!) 148/61  (!) 159/45  Pulse: (!) 102  81  Resp: (!) 22  (!) 25  Temp: 98.2 F (36.8 C)    TempSrc: Rectal    SpO2: 100%  100%  Weight:  63.5 kg   Height:  '5\' 3"'  (1.6 m)     General: Vital signs reviewed.  Patient is well-developed elderly  lady, in no acute distress and cooperative with exam.  Head: Normocephalic and atraumatic. Eyes: EOMI, conjunctivae normal, no scleral icterus.  ENMT: Mucous membranes are dry. Neck: Supple, trachea midline, normal ROM,  Cardiovascular: RRR, S1 normal, S2 normal, no murmurs, gallops, or rubs. Pulmonary/Chest: Clear to auscultation bilaterally, no wheezes, rales, or rhonchi. Abdominal: Soft, non-tender, non-distended, BS +, Extremities: No lower extremity edema bilaterally,  pulses symmetric and intact bilaterally. No cyanosis or clubbing. Neurological: A&O x3,  Strength is normal and symmetric bilaterally, cranial nerve II-XII are grossly intact, no focal motor deficit, sensory intact to light touch bilaterally.  Psychiatric: Normal mood and affect.   Labs on Admission: I have personally reviewed following labs and imaging studies  CBC: Recent Labs  Lab 01/17/20 1339  WBC 11.8*  HGB 10.9*  HCT 36.2  MCV 93.1  PLT 751   Basic Metabolic Panel: Recent Labs  Lab 01/17/20 1339  NA 141  K 4.7  CL 113*  CO2 19*  GLUCOSE 179*  BUN 32*  CREATININE 1.68*  CALCIUM 8.9   GFR: Estimated Creatinine Clearance: 18.4 mL/min (A) (by C-G formula based on SCr of 1.68 mg/dL (H)). Liver Function Tests: Recent Labs  Lab 01/17/20 1339  AST 18  ALT 10  ALKPHOS 90  BILITOT 0.4  PROT 6.8  ALBUMIN 3.7   No results for input(s): LIPASE, AMYLASE in the last 168 hours. No results for input(s): AMMONIA in the last 168 hours. Coagulation Profile: No results for input(s): INR, PROTIME in the last 168 hours. Cardiac Enzymes: No results for input(s): CKTOTAL, CKMB, CKMBINDEX, TROPONINI in the last 168 hours. BNP (last 3 results) No results for input(s): PROBNP in the last 8760 hours. HbA1C: No results for input(s): HGBA1C in the last 72 hours. CBG: No results for input(s): GLUCAP in the last 168 hours. Lipid Profile: No results for input(s): CHOL, HDL, LDLCALC, TRIG, CHOLHDL, LDLDIRECT in the last 72 hours. Thyroid Function Tests: No results for input(s): TSH, T4TOTAL, FREET4, T3FREE, THYROIDAB in the last 72 hours. Anemia Panel: No results for input(s): VITAMINB12, FOLATE, FERRITIN, TIBC, IRON, RETICCTPCT in the last 72 hours. Urine analysis:    Component Value Date/Time   COLORURINE AMBER (A) 01/17/2020 1339   APPEARANCEUR CLOUDY (A) 01/17/2020 1339   APPEARANCEUR Clear 01/06/2014 0948   LABSPEC 1.013 01/17/2020 1339   LABSPEC 1.010 01/06/2014 0948   PHURINE 5.0 01/17/2020 1339   GLUCOSEU NEGATIVE 01/17/2020 1339   GLUCOSEU Negative  01/06/2014 0948   HGBUR NEGATIVE 01/17/2020 1339   BILIRUBINUR NEGATIVE 01/17/2020 1339   BILIRUBINUR Negative 01/06/2014 0948   KETONESUR NEGATIVE 01/17/2020 1339   PROTEINUR 100 (A) 01/17/2020 1339   NITRITE NEGATIVE 01/17/2020 1339   LEUKOCYTESUR NEGATIVE 01/17/2020 1339   LEUKOCYTESUR Negative 01/06/2014 0948    Radiological Exams on Admission: CT ABDOMEN PELVIS WO CONTRAST  Result Date: 01/17/2020 CLINICAL DATA:  Abdominal pain EXAM: CT ABDOMEN AND PELVIS WITHOUT CONTRAST TECHNIQUE: Multidetector CT imaging of the abdomen and pelvis was performed following the standard protocol without IV contrast. COMPARISON:  02/03/2016 FINDINGS: Lower chest: No acute abnormality. Hepatobiliary: No focal liver abnormality is seen. Status post cholecystectomy. No biliary dilatation. Pancreas: Unremarkable. No pancreatic ductal dilatation or surrounding inflammatory changes. Spleen: Scattered granulomas are noted throughout the spleen. Adrenals/Urinary Tract: Adrenal glands are within normal limits. Kidneys demonstrate cystic changes bilaterally. No renal calculi or obstructive changes are noted. Bladder is decompressed. Stomach/Bowel: Colon demonstrates evidence of diverticular change within the sigmoid. Some mild pericolonic inflammatory changes are noted  consistent with diverticulitis in the sigmoid. No perforation or abscess formation is noted. Small bowel and stomach appear within normal limits. Vascular/Lymphatic: Aortic atherosclerosis. No enlarged abdominal or pelvic lymph nodes. Reproductive: Status post hysterectomy. No adnexal masses. Other: No abdominal wall hernia or abnormality. No abdominopelvic ascites. Musculoskeletal: No acute or significant osseous findings. IMPRESSION: Mild diverticulitis without evidence of abscess or perforation. Prior granulomatous disease. Electronically Signed   By: Inez Catalina M.D.   On: 01/17/2020 14:51   CT Head Wo Contrast  Addendum Date: 01/17/2020   ADDENDUM  REPORT: 01/17/2020 15:00 ADDENDUM: These results were called by telephone at the time of interpretation on 01/17/2020 at 3:00 pm to provider Dr. Joni Fears, who verbally acknowledged these results. Electronically Signed   By: Kellie Simmering DO   On: 01/17/2020 15:00   Result Date: 01/17/2020 CLINICAL DATA:  Mental status change, unknown cause. EXAM: CT HEAD WITHOUT CONTRAST TECHNIQUE: Contiguous axial images were obtained from the base of the skull through the vertex without intravenous contrast. COMPARISON:  Prior head CT 12/19/2019. FINDINGS: Brain: Mild-to-moderate cerebral atrophy. Intermediate to low-density subdural collection overlying the left cerebral hemisphere measuring up to 8 mm in greatest thickness (for instance as seen on series 4, image 27). Slight mass effect upon the underlying left cerebral hemisphere without midline shift. Mild ill-defined hypoattenuation within the cerebral white matter is nonspecific, but compatible with chronic small vessel ischemic disease. Redemonstrated chronic benign punctate calcification within the dorsal right pons. No demarcated cortical infarct. No evidence of intracranial mass. Vascular: No hyperdense vessel.  Atherosclerotic calcifications. Skull: Normal. Negative for fracture or focal lesion. Sinuses/Orbits: Visualized orbits show no acute finding. Trace ethmoid sinus mucosal thickening. Other: Right mastoid effusion. IMPRESSION: Subacute to chronic left hemispheric subdural hematoma measuring 8 mm in greatest thickness. Slight mass effect upon the underlying left cerebral hemisphere without midline shift. No evidence of acute infarction. Stable mild-to-moderate cerebral atrophy and mild chronic small vessel ischemic disease. Right mastoid effusion. Electronically Signed: By: Kellie Simmering DO On: 01/17/2020 14:57   DG Chest Portable 1 View  Result Date: 01/17/2020 CLINICAL DATA:  Weakness EXAM: PORTABLE CHEST 1 VIEW COMPARISON:  December 26, 2019 FINDINGS: Lungs  are clear. Heart size and pulmonary vascularity are normal. No adenopathy. There is prominence of the aortic arch with aortic atherosclerosis. No bone lesions. There is upper lumbar levoscoliosis. IMPRESSION: Prominence aortic arch with aortic atherosclerosis. This aortic arch prominence may be reflective of chronic hypertensive change. No edema or airspace opacity. Heart size normal. Electronically Signed   By: Lowella Grip III M.D.   On: 01/17/2020 14:00    Assessment/Plan Active Problems:   Sepsis ([place])   Sepsis secondary to diverticulitis.  Initially met sepsis criteria with mild tachycardia, tachypnea, leukocytosis, AKI and lactic acidosis.  CT abdomen with concern of diverticulitis.  Received 1 dose of Zosyn. Patient did not appear septic.  No more bowel movement since came to ED She wants to go back home by tomorrow. -Admit under observation-can be discharged on p.o. antibiotics if remains stable and diarrhea improves. -Continue with Zosyn. -IV fluid as she appears little dry. -Supportive care as needed. -Follow-up blood cultures. -Repeat Lactic acid -Follow-up GI pathogen-not done yet as patient did not had any more diarrhea since in ED.  Subacute/chronic subdural hematoma.  Denies any headache or change in her vision.  Might be as a result of prior injury which occurs a month ago.  Neurosurgery was consulted by ED provider-pending evaluation.  AKI with most likely  CKD of stage IIIa or B.  According to her lab results found in the system she might had CKD stage IIIb with baseline creatinine around 1.6-1.7.  Appears dehydrated. -Provide IV fluid. -Monitor renal function. -Avoid nephrotoxins  Hypertension.  Pressure elevated.  Patient did not get her home dose of amlodipine and lisinopril this morning as she went out for breakfast. -Continue home dose of amlodipine and lisinopril  Depression.  No acute concern. -Dose of Zoloft   DVT prophylaxis: SCDs Code Status: Full  code Family Communication: Son was updated at bedside Disposition Plan: Home Consults called: Neurosurgery Admission status: Observation   Lorella Nimrod MD [facility]  If 7PM-7AM, please contact night-coverage [email or web address]  01/17/2020, 5:02 PM   This record has been created using Systems analyst. Errors have been sought and corrected,but may not always be located. Such creation errors do not reflect on the standard of care.

## 2020-01-17 NOTE — ED Provider Notes (Signed)
[facility] Emergency Department Provider Note  ____________________________________________   Event Date/Time   First MD Initiated Contact with Patient 01/17/20 1319     (approximate)  I have reviewed the triage vital signs and the nursing notes.   HISTORY  Chief Complaint Altered Mental Status    HPI Elizabeth Castillo is a 86 y.o. female  With h/o HTN, skin CA, here with diarrhea, weakness. Pt reports that she was in her usual state of health until this afternoon. Pt reportedly went to a restaurant and was eating with her son, when she got up to say she needed to go to the bathroom. She then had a large, loose bowel movement. She then became confused, unable to answer questions, shaking. She has since had multiple loose BMs. Denies any known abd pain or sx prior to this. No known sick contacts. Son has not been sick per her report. Denies any HA. No h/o similar issues. Denies any pain currently, no urinary sx.        Past Medical History:  Diagnosis Date  . Cancer ([place])    skin cancer  . Hypertension     Patient Active Problem List   Diagnosis Date Noted  . Sepsis ([place]) 01/17/2020    Past Surgical History:  Procedure Laterality Date  . ABDOMINAL HYSTERECTOMY      Prior to Admission medications   Medication Sig Start Date End Date Taking? Authorizing Provider  acetaminophen (TYLENOL) 500 MG tablet Take 1,000 mg by mouth every 6 (six) hours as needed.   Yes [provider]  amLODipine (NORVASC) 10 MG tablet Take 10 mg by mouth daily. 10/13/18  Yes [provider]  lisinopril (ZESTRIL) 30 MG tablet Take 30 mg by mouth daily. 10/13/18  Yes [provider]  omeprazole (PRILOSEC) 20 MG capsule Take 20 mg by mouth daily. 10/13/18  Yes [provider]  sertraline (ZOLOFT) 100 MG tablet Take 100 mg by mouth daily. 10/13/18  Yes [provider]  allopurinol (ZYLOPRIM) 100 MG tablet Take 100 mg by mouth daily.  04/21/18   [provider]    Allergies Patient has no known allergies.  History reviewed. No pertinent family history.  Social History Social History   Tobacco Use  . Smoking status: Never Smoker  . Smokeless tobacco: Never Used  Substance Use Topics  . Alcohol use: No    Review of Systems  Review of Systems  Constitutional: Positive for fatigue. Negative for fever.  HENT: Negative for congestion and sore throat.   Eyes: Negative for visual disturbance.  Respiratory: Negative for cough and shortness of breath.   Cardiovascular: Negative for chest pain.  Gastrointestinal: Positive for diarrhea and nausea. Negative for abdominal pain and vomiting.  Genitourinary: Negative for flank pain.  Musculoskeletal: Negative for back pain and neck pain.  Skin: Negative for rash and wound.  Neurological: Positive for tremors and weakness.  All other systems reviewed and are negative.    ____________________________________________  PHYSICAL EXAM:      VITAL SIGNS: ED Triage Vitals  Enc Vitals Group     BP      Pulse      Resp      Temp      Temp src      SpO2      Weight      Height      Head Circumference      Peak Flow      Pain Score  Pain Loc      Pain Edu?      Excl. in [place]?      Physical Exam Vitals and nursing note reviewed.  Constitutional:      General: She is not in acute distress.    Appearance: She is well-developed.  HENT:     Head: Normocephalic and atraumatic.     Mouth/Throat:     Mouth: Mucous membranes are dry.  Eyes:     Conjunctiva/sclera: Conjunctivae normal.     Pupils: Pupils are equal, round, and reactive to light.  Cardiovascular:     Rate and Rhythm: Regular rhythm. Tachycardia present.     Heart sounds: Normal heart sounds. No murmur heard. No friction rub.  Pulmonary:     Effort: Pulmonary effort is normal. No respiratory distress.     Breath sounds: Normal breath sounds. No wheezing or rales.  Abdominal:      General: There is no distension.     Palpations: Abdomen is soft.     Tenderness: There is abdominal tenderness (generalized).  Musculoskeletal:     Cervical back: Neck supple.     Right lower leg: No edema.     Left lower leg: No edema.  Skin:    General: Skin is warm.     Capillary Refill: Capillary refill takes less than 2 seconds.  Neurological:     Mental Status: She is alert and oriented to person, place, and time.     Motor: No abnormal muscle tone.       ____________________________________________   LABS (all labs ordered are listed, but only abnormal results are displayed)  Labs Reviewed  BASIC METABOLIC PANEL - Abnormal; Notable for the following components:      Result Value   Chloride 113 (*)    CO2 19 (*)    Glucose, Bld 179 (*)    BUN 32 (*)    Creatinine, Ser 1.68 (*)    GFR, Estimated 28 (*)    All other components within normal limits  CBC - Abnormal; Notable for the following components:   WBC 11.8 (*)    Hemoglobin 10.9 (*)    All other components within normal limits  URINALYSIS, COMPLETE (UACMP) WITH MICROSCOPIC - Abnormal; Notable for the following components:   Color, Urine AMBER (*)    APPearance CLOUDY (*)    Protein, ur 100 (*)    All other components within normal limits  LACTIC ACID, PLASMA - Abnormal; Notable for the following components:   Lactic Acid, Venous 2.1 (*)    All other components within normal limits  RESP PANEL BY RT-PCR (FLU A&B, COVID) ARPGX2  CULTURE, BLOOD (ROUTINE X 2)  CULTURE, BLOOD (ROUTINE X 2)  GASTROINTESTINAL PANEL BY PCR, STOOL (REPLACES STOOL CULTURE)  HEPATIC FUNCTION PANEL  LACTIC ACID, PLASMA  BASIC METABOLIC PANEL  CBC    ____________________________________________  EKG: Suspected normal sinus rhythm, ventricular rate 83.  QRS 25, QTc 425.  Significant artifact secondary to tremors, recommend recollection.  Within these limitations, no obvious ST elevations or  depressions. ________________________________________  RADIOLOGY All imaging, including plain films, CT scans, and ultrasounds, independently reviewed by me, and interpretations confirmed via formal radiology reads.  ED MD interpretation:   CT head: Subacute to chronic left hemispheric subdural measuring 8 mm, no midline shift CT evidence/pelvis: Acute diverticulitis without complication Chest was read: Clear  Official radiology report(s): CT ABDOMEN PELVIS WO CONTRAST  Result Date: 01/17/2020 CLINICAL DATA:  Abdominal pain EXAM: CT ABDOMEN AND  PELVIS WITHOUT CONTRAST TECHNIQUE: Multidetector CT imaging of the abdomen and pelvis was performed following the standard protocol without IV contrast. COMPARISON:  02/03/2016 FINDINGS: Lower chest: No acute abnormality. Hepatobiliary: No focal liver abnormality is seen. Status post cholecystectomy. No biliary dilatation. Pancreas: Unremarkable. No pancreatic ductal dilatation or surrounding inflammatory changes. Spleen: Scattered granulomas are noted throughout the spleen. Adrenals/Urinary Tract: Adrenal glands are within normal limits. Kidneys demonstrate cystic changes bilaterally. No renal calculi or obstructive changes are noted. Bladder is decompressed. Stomach/Bowel: Colon demonstrates evidence of diverticular change within the sigmoid. Some mild pericolonic inflammatory changes are noted consistent with diverticulitis in the sigmoid. No perforation or abscess formation is noted. Small bowel and stomach appear within normal limits. Vascular/Lymphatic: Aortic atherosclerosis. No enlarged abdominal or pelvic lymph nodes. Reproductive: Status post hysterectomy. No adnexal masses. Other: No abdominal wall hernia or abnormality. No abdominopelvic ascites. Musculoskeletal: No acute or significant osseous findings. IMPRESSION: Mild diverticulitis without evidence of abscess or perforation. Prior granulomatous disease. Electronically Signed   By: Inez Catalina  M.D.   On: 01/17/2020 14:51   CT Head Wo Contrast  Addendum Date: 01/17/2020   ADDENDUM REPORT: 01/17/2020 15:00 ADDENDUM: These results were called by telephone at the time of interpretation on 01/17/2020 at 3:00 pm to provider Dr. Joni Fears, who verbally acknowledged these results. Electronically Signed   By: Kellie Simmering DO   On: 01/17/2020 15:00   Result Date: 01/17/2020 CLINICAL DATA:  Mental status change, unknown cause. EXAM: CT HEAD WITHOUT CONTRAST TECHNIQUE: Contiguous axial images were obtained from the base of the skull through the vertex without intravenous contrast. COMPARISON:  Prior head CT 12/19/2019. FINDINGS: Brain: Mild-to-moderate cerebral atrophy. Intermediate to low-density subdural collection overlying the left cerebral hemisphere measuring up to 8 mm in greatest thickness (for instance as seen on series 4, image 27). Slight mass effect upon the underlying left cerebral hemisphere without midline shift. Mild ill-defined hypoattenuation within the cerebral white matter is nonspecific, but compatible with chronic small vessel ischemic disease. Redemonstrated chronic benign punctate calcification within the dorsal right pons. No demarcated cortical infarct. No evidence of intracranial mass. Vascular: No hyperdense vessel.  Atherosclerotic calcifications. Skull: Normal. Negative for fracture or focal lesion. Sinuses/Orbits: Visualized orbits show no acute finding. Trace ethmoid sinus mucosal thickening. Other: Right mastoid effusion. IMPRESSION: Subacute to chronic left hemispheric subdural hematoma measuring 8 mm in greatest thickness. Slight mass effect upon the underlying left cerebral hemisphere without midline shift. No evidence of acute infarction. Stable mild-to-moderate cerebral atrophy and mild chronic small vessel ischemic disease. Right mastoid effusion. Electronically Signed: By: Kellie Simmering DO On: 01/17/2020 14:57   DG Chest Portable 1 View  Result Date:  01/17/2020 CLINICAL DATA:  Weakness EXAM: PORTABLE CHEST 1 VIEW COMPARISON:  December 26, 2019 FINDINGS: Lungs are clear. Heart size and pulmonary vascularity are normal. No adenopathy. There is prominence of the aortic arch with aortic atherosclerosis. No bone lesions. There is upper lumbar levoscoliosis. IMPRESSION: Prominence aortic arch with aortic atherosclerosis. This aortic arch prominence may be reflective of chronic hypertensive change. No edema or airspace opacity. Heart size normal. Electronically Signed   By: Lowella Grip III M.D.   On: 01/17/2020 14:00    ____________________________________________  PROCEDURES   Procedure(s) performed (including Critical Care):  .Critical Care Performed by: Duffy Bruce, MD Authorized by: Duffy Bruce, MD   Critical care provider statement:    Critical care time (minutes):  35   Critical care time was exclusive of:  Separately billable procedures  and treating other patients and teaching time   Critical care was necessary to treat or prevent imminent or life-threatening deterioration of the following conditions:  Cardiac failure, circulatory failure, respiratory failure and sepsis   Critical care was time spent personally by me on the following activities:  Development of treatment plan with patient or surrogate, discussions with consultants, evaluation of patient's response to treatment, examination of patient, obtaining history from patient or surrogate, ordering and performing treatments and interventions, ordering and review of laboratory studies, ordering and review of radiographic studies, pulse oximetry, re-evaluation of patient's condition and review of old charts   I assumed direction of critical care for this patient from another provider in my specialty: no   .1-3 Lead EKG Interpretation Performed by: Duffy Bruce, MD Authorized by: Duffy Bruce, MD     Interpretation: non-specific     ECG rate:  80-110   ECG rate  assessment: normal     Rhythm: sinus rhythm     Ectopy: none     Conduction: normal   Comments:     Indication: Tachycardia    ____________________________________________  INITIAL IMPRESSION / MDM / [place] / ED COURSE  As part of my medical decision making, I reviewed the following data within the [place] notes reviewed and incorporated, Old chart reviewed, Notes from prior ED visits, and [place] Controlled Substance Database       *CASSITY DEPOLO was evaluated in Emergency Department on 01/17/2020 for the symptoms described in the history of present illness. She was evaluated in the context of the global COVID-19 pandemic, which necessitated consideration that the patient might be at risk for infection with the SARS-CoV-2 virus that causes COVID-19. Institutional protocols and algorithms that pertain to the evaluation of patients at risk for COVID-19 are in a state of rapid change based on information released by regulatory bodies including the CDC and federal and state organizations. These policies and algorithms were followed during the patient's care in the ED.  Some ED evaluations and interventions may be delayed as a result of limited staffing during the pandemic.*     Medical Decision Making: 86 year old female here with abdominal pain, weakness, diarrhea, and confusion.  Regarding her diarrhea and abdominal pain, she appears to be in moderate distress on arrival, tachycardic, and dehydrated clinically.  Patient started on fluids.  Lab work shows mild leukocytosis and suspected dehydration with elevated BUN/creatinine ratio.  LFTs normal.  Lactic acid mildly elevated at 2.1.  CT abdomen/pelvis obtained, shows acute diverticulitis without complication.  Will start on IV Zosyn and admit given her age and diarrhea with lactic acid elevation and increased white blood cell count.  Regarding her confusion, CT head obtained.  Of note, she fell on 11/24.  She  does have a subdural hematoma noted without midline shift.  Discussed with Dr. Cari Caraway.  This will likely be nonoperative but he will see the patient.  Recommends repeat CT in 6 hours.  ____________________________________________  FINAL CLINICAL IMPRESSION(S) / ED DIAGNOSES  Final diagnoses:  Subdural hematoma ([place])  Diverticulitis  Dehydration     MEDICATIONS GIVEN DURING THIS VISIT:  Medications  sodium chloride 0.9 % bolus 1,000 mL (1,000 mLs Intravenous Not Given 01/17/20 1721)  sodium chloride flush (NS) 0.9 % injection 3 mL (has no administration in time range)  0.9 %  sodium chloride infusion ( Intravenous New Bag/Given 01/17/20 1721)  acetaminophen (TYLENOL) tablet 650 mg (650 mg Oral Given 01/17/20 1955)  Or  acetaminophen (TYLENOL) suppository 650 mg ( Rectal See Alternative 01/17/20 1955)  ondansetron (ZOFRAN) tablet 4 mg ( Oral See Alternative 01/17/20 1954)    Or  ondansetron (ZOFRAN) injection 4 mg (4 mg Intravenous Given 01/17/20 1954)  amLODipine (NORVASC) tablet 10 mg (10 mg Oral Given 01/17/20 1954)  lisinopril (ZESTRIL) tablet 30 mg (30 mg Oral Given 01/17/20 1954)  pantoprazole (PROTONIX) EC tablet 40 mg (40 mg Oral Given 01/17/20 1955)  sertraline (ZOLOFT) tablet 100 mg (100 mg Oral Given 01/17/20 1955)  sodium chloride 0.9 % bolus 1,000 mL (1,000 mLs Intravenous Bolus 01/17/20 1349)  piperacillin-tazobactam (ZOSYN) IVPB 3.375 g (0 g Intravenous Stopped 01/17/20 1747)     ED Discharge Orders    None       Note:  This document was prepared using Dragon voice recognition software and may include unintentional dictation errors.   Duffy Bruce, MD 01/17/20 2039

## 2020-01-17 NOTE — ED Triage Notes (Signed)
Pt arrived via [facility] [place] [employer] where she was eating with her son,per EMS son reports the pt was eating and told him she didn't feel well, got up and went to the bathroom and had large bowel movement. Pt was then found to have altered mental status, unable to answer questions. Pt also shaking per EMS.   Pt arrived covered in stool, cleaned up and changed.  Pt c/o leg pain with EMS.

## 2020-01-17 NOTE — ED Notes (Addendum)
Pt presents to ED via EMS with c/o of becoming altered today while eating lunch with her son. Per pt and son, pt was having a normal day and then went out to eat and pt states she went to the restroom at the resturant to have a BM and then states she had to go 2 more times and on the 3rd time "I couldn't make it". Pt presented to ED covered in feces from her waist down. Pt denies urinary symptoms. Pt denies ABD pain, N/V/D. Pt denies bowel issues prior to this ED visit. Pt also presents tremorish. Rectal temp obtained along with a straight cath urine. Pt placed on cardiac monitor. Son states he did not notice any thing different with pt. Pt lives alone by herself. Son states slight confusion since pt fell and hit her head a few days before thanksgiving. Pt is currenlty A&Ox4. Pt denies chest pain or SOB.

## 2020-01-18 DIAGNOSIS — K5792 Diverticulitis of intestine, part unspecified, without perforation or abscess without bleeding: Secondary | ICD-10-CM | POA: Diagnosis not present

## 2020-01-18 DIAGNOSIS — S065XAA Traumatic subdural hemorrhage with loss of consciousness status unknown, initial encounter: Secondary | ICD-10-CM

## 2020-01-18 DIAGNOSIS — E860 Dehydration: Secondary | ICD-10-CM | POA: Diagnosis not present

## 2020-01-18 DIAGNOSIS — S065X9A Traumatic subdural hemorrhage with loss of consciousness of unspecified duration, initial encounter: Secondary | ICD-10-CM

## 2020-01-18 MED ORDER — AMOXICILLIN-POT CLAVULANATE 875-125 MG PO TABS: 1.0000 | ORAL_TABLET | Freq: Two times a day (BID) | ORAL | 0 refills | Status: AC

## 2020-01-18 MED ORDER — AMOXICILLIN-POT CLAVULANATE 500-125 MG PO TABS
500.0000 mg | ORAL_TABLET | Freq: Two times a day (BID) | ORAL | Status: DC
  Filled 2020-01-18 (×2): qty 1

## 2020-01-18 MED ORDER — AMOXICILLIN-POT CLAVULANATE ER 1000-62.5 MG PO TB12: 1.0000 | ORAL_TABLET | Freq: Two times a day (BID) | ORAL | Status: DC

## 2020-01-18 NOTE — ED Notes (Signed)
Pt assisted with bed pan

## 2020-01-18 NOTE — Discharge Summary (Signed)
Physician Discharge Summary  SCOUT GUYETT QPY:[id number] DOB: Sep 09, 1923 DOA: 01/17/2020  PCP: Ellamae Sia, MD  Admit date: 01/17/2020 Discharge date: 01/18/2020  Admitted From: Home Disposition:  Home  Recommendations for Outpatient Follow-up:  1. Follow up with PCP in 1-2 weeks 2. Please obtain BMP/CBC in one week 3. Please follow up on the following pending results:None  Home Health:No Equipment/Devices: Rolling walker Discharge Condition: Stable CODE STATUS: Full Diet recommendation: Heart Healthy / Carb Modified / Regular / Dysphagia   Brief/Interim Summary: Elizabeth Castillo is a 86 y.o. female with medical history significant of hypertension, skin cancer came to ED with complaints of generalized weakness, abdominal pain and diarrhea. Patient was having abdominal pain since this morning, went out to eat in a restaurant with her son where she started diarrhea, multiple episodes in a short duration of time with increased weakness and lethargy.  She came in covered with feces.  No history of fecal incontinence.  No nausea or vomiting.  No fever or chills.  No sick contacts.  No urinary symptoms.  Patient lives alone and independent for her ADLs.  CT abdomen with concern of diverticulitis.  Initially met sepsis criteria with tachycardia, tachypnea and leukocytosis.  Lactic acid mildly elevated at 2.1 which resolved with IV fluid.  She was given Zosyn while in the hospital and discharged on Augmentin.  Patient stays stable, afebrile, did not had any more episodes of diarrhea while in the hospital.  Initially GI pathogen ordered but never obtained as she did not had any bowel movement.  COVID-19 was negative.  Blood cultures remain negative.  Of note patient fell approximately a month ago, resulted in head injury with laceration, came to ED, CT head with hematoma at that time and staples were placed.  Repeat CT head today with subacute to chronic subdural hematoma. Her son since that  fall she appears little off balance and at times confused. Patient denies any headaches or change in her vision. Neurosurgery was consulted by ED and they are recommending outpatient follow-up.  Patient will continue with rest of her home medications and follow-up with her providers.  Discharge Diagnoses:  Active Problems:   Sepsis ([place])   Subdural hematoma ([facility])   Diverticulitis   Dehydration   Discharge Instructions  Discharge Instructions    Diet - low sodium heart healthy   Complete by: As directed    Discharge instructions   Complete by: As directed    It was pleasure taking care of you. You are being given antibiotics for 1 week-take it with meals. Keep yourself well-hydrated. Follow-up with your primary care provider.   Increase activity slowly   Complete by: As directed      Allergies as of 01/18/2020   No Known Allergies     Medication List    TAKE these medications   acetaminophen 500 MG tablet Commonly known as: TYLENOL Take 1,000 mg by mouth every 6 (six) hours as needed.   allopurinol 100 MG tablet Commonly known as: ZYLOPRIM Take 100 mg by mouth daily.   amLODipine 10 MG tablet Commonly known as: NORVASC Take 10 mg by mouth daily.   amoxicillin-clavulanate 875-125 MG tablet Commonly known as: AUGMENTIN Take 1 tablet by mouth 2 (two) times daily for 5 days.   lisinopril 30 MG tablet Commonly known as: ZESTRIL Take 30 mg by mouth daily.   omeprazole 20 MG capsule Commonly known as: PRILOSEC Take 20 mg by mouth daily.   sertraline 100 MG tablet  Commonly known as: ZOLOFT Take 100 mg by mouth daily.       Follow-up Information    Burns, Harriett P, MD. Schedule an appointment as soon as possible for a visit in 1 week.   Specialty: Internal Medicine Why: Patient to make own follow up appt Contact information: [place] [address] [phone]        Schedule an appointment as soon as possible for a visit  with Meade Maw, MD.   Specialty: Neurosurgery Why: Patient to make own follow up appt Contact information: [place] [address] [phone]              No Known Allergies  Consultations:  Neurosurgery  Procedures/Studies: CT ABDOMEN PELVIS WO CONTRAST  Result Date: 01/17/2020 CLINICAL DATA:  Abdominal pain EXAM: CT ABDOMEN AND PELVIS WITHOUT CONTRAST TECHNIQUE: Multidetector CT imaging of the abdomen and pelvis was performed following the standard protocol without IV contrast. COMPARISON:  02/03/2016 FINDINGS: Lower chest: No acute abnormality. Hepatobiliary: No focal liver abnormality is seen. Status post cholecystectomy. No biliary dilatation. Pancreas: Unremarkable. No pancreatic ductal dilatation or surrounding inflammatory changes. Spleen: Scattered granulomas are noted throughout the spleen. Adrenals/Urinary Tract: Adrenal glands are within normal limits. Kidneys demonstrate cystic changes bilaterally. No renal calculi or obstructive changes are noted. Bladder is decompressed. Stomach/Bowel: Colon demonstrates evidence of diverticular change within the sigmoid. Some mild pericolonic inflammatory changes are noted consistent with diverticulitis in the sigmoid. No perforation or abscess formation is noted. Small bowel and stomach appear within normal limits. Vascular/Lymphatic: Aortic atherosclerosis. No enlarged abdominal or pelvic lymph nodes. Reproductive: Status post hysterectomy. No adnexal masses. Other: No abdominal wall hernia or abnormality. No abdominopelvic ascites. Musculoskeletal: No acute or significant osseous findings. IMPRESSION: Mild diverticulitis without evidence of abscess or perforation. Prior granulomatous disease. Electronically Signed   By: Inez Catalina M.D.   On: 01/17/2020 14:51   DG Ribs Unilateral W/Chest Right  Result Date: 12/26/2019 CLINICAL DATA:  Right rib pain, fall EXAM: RIGHT RIBS AND CHEST - 3+ VIEW COMPARISON:   10/13/2018 chest radiograph. FINDINGS: Stable cardiomediastinal silhouette with normal heart size. No pneumothorax. No pleural effusion. Lungs appear clear, with no acute consolidative airspace disease and no pulmonary edema. No fracture or focal osseous lesion detected in the right ribs. IMPRESSION: No active cardiopulmonary disease. No right rib fracture detected. Should the patient's symptoms persist or worsen, repeat radiographs of the ribs in 10 - 14 days maybe of use to detect subtle nondisplaced rib fractures (which are commonly occult on initial imaging). Electronically Signed   By: Ilona Sorrel M.D.   On: 12/26/2019 12:54   CT Head Wo Contrast  Result Date: 01/17/2020 CLINICAL DATA:  Follow-up subdural hematoma. EXAM: CT HEAD WITHOUT CONTRAST TECHNIQUE: Contiguous axial images were obtained from the base of the skull through the vertex without intravenous contrast. COMPARISON:  01/17/2020 at 2:28 p.m. FINDINGS: Brain: A small intermediate to low density subdural hematoma over the left cerebral convexity is unchanged in size measuring up to 7 mm in thickness. Mild mass effect on the left frontal lobe is unchanged without midline shift. No new intracranial hemorrhage, acute infarct, or mass is identified. There is a chronic punctate calcification in the dorsal right pons. Hypodensities in the cerebral white matter bilaterally are unchanged and nonspecific but compatible with mild chronic small vessel ischemic disease, not considered abnormal for age. There is mild cerebral atrophy. Vascular: Calcified atherosclerosis at the skull base. Skull: No acute  fracture or suspicious osseous lesion. Sinuses/Orbits: Clear paranasal sinuses. Unchanged right mastoid effusion. Left cataract extraction. Other: None. IMPRESSION: 1. Unchanged small left cerebral convexity subdural hematoma. No midline shift. 2. No evidence of new intracranial abnormality. Electronically Signed   By: Logan Bores M.D.   On: 01/17/2020  21:37   CT Head Wo Contrast  Addendum Date: 01/17/2020   ADDENDUM REPORT: 01/17/2020 15:00 ADDENDUM: These results were called by telephone at the time of interpretation on 01/17/2020 at 3:00 pm to provider Dr. Joni Fears, who verbally acknowledged these results. Electronically Signed   By: Kellie Simmering DO   On: 01/17/2020 15:00   Result Date: 01/17/2020 CLINICAL DATA:  Mental status change, unknown cause. EXAM: CT HEAD WITHOUT CONTRAST TECHNIQUE: Contiguous axial images were obtained from the base of the skull through the vertex without intravenous contrast. COMPARISON:  Prior head CT 12/19/2019. FINDINGS: Brain: Mild-to-moderate cerebral atrophy. Intermediate to low-density subdural collection overlying the left cerebral hemisphere measuring up to 8 mm in greatest thickness (for instance as seen on series 4, image 27). Slight mass effect upon the underlying left cerebral hemisphere without midline shift. Mild ill-defined hypoattenuation within the cerebral white matter is nonspecific, but compatible with chronic small vessel ischemic disease. Redemonstrated chronic benign punctate calcification within the dorsal right pons. No demarcated cortical infarct. No evidence of intracranial mass. Vascular: No hyperdense vessel.  Atherosclerotic calcifications. Skull: Normal. Negative for fracture or focal lesion. Sinuses/Orbits: Visualized orbits show no acute finding. Trace ethmoid sinus mucosal thickening. Other: Right mastoid effusion. IMPRESSION: Subacute to chronic left hemispheric subdural hematoma measuring 8 mm in greatest thickness. Slight mass effect upon the underlying left cerebral hemisphere without midline shift. No evidence of acute infarction. Stable mild-to-moderate cerebral atrophy and mild chronic small vessel ischemic disease. Right mastoid effusion. Electronically Signed: By: Kellie Simmering DO On: 01/17/2020 14:57   CT Head Wo Contrast  Result Date: 12/19/2019 CLINICAL DATA:  Witnessed fall  with trauma to the head and face. EXAM: CT HEAD WITHOUT CONTRAST TECHNIQUE: Contiguous axial images were obtained from the base of the skull through the vertex without intravenous contrast. COMPARISON:  01/06/2014 FINDINGS: Brain: Age related volume loss. Mild chronic small-vessel ischemic change of the cerebral hemispheric white matter. Benign punctate calcification in the right pons, unchanged since 2015. No evidence of mass, hemorrhage, hydrocephalus or extra-axial collection. Vascular: There is atherosclerotic calcification of the major vessels at the base of the brain. Skull: No skull fracture. Sinuses/Orbits: Clear/normal Other: Left parietal scalp hematoma. Some mastoid fluid on the right without evidence of mastoid or temporal bone trauma. IMPRESSION: 1. Left parietal scalp hematoma. No underlying skull fracture. No acute intracranial finding. Age related volume loss and mild chronic small-vessel ischemic change of the white matter. 2. Some mastoid fluid on the right without evidence of mastoid or temporal bone trauma. Electronically Signed   By: Nelson Chimes M.D.   On: 12/19/2019 14:17   DG Chest Portable 1 View  Result Date: 01/17/2020 CLINICAL DATA:  Weakness EXAM: PORTABLE CHEST 1 VIEW COMPARISON:  December 26, 2019 FINDINGS: Lungs are clear. Heart size and pulmonary vascularity are normal. No adenopathy. There is prominence of the aortic arch with aortic atherosclerosis. No bone lesions. There is upper lumbar levoscoliosis. IMPRESSION: Prominence aortic arch with aortic atherosclerosis. This aortic arch prominence may be reflective of chronic hypertensive change. No edema or airspace opacity. Heart size normal. Electronically Signed   By: Lowella Grip III M.D.   On: 01/17/2020 14:00  Subjective: Patient was crying when I entered the room, stating that you all are trying to keep her hostage so she cannot celebrate Christmas.  She wants to go home. Son at bedside.  Discharge  Exam: Vitals:   01/18/20 0717 01/18/20 0830  BP: (!) 137/52 (!) 157/50  Pulse: 83 70  Resp: 16 18  Temp: 98.6 F (37 C) 98.5 F (36.9 C)  SpO2: 98% 99%   Vitals:   01/18/20 0300 01/18/20 0530 01/18/20 0717 01/18/20 0830  BP:  (!) 140/43 (!) 137/52 (!) 157/50  Pulse: 74 71 83 70  Resp: '18 18 16 18  ' Temp:   98.6 F (37 C) 98.5 F (36.9 C)  TempSrc:   Oral   SpO2: 99% 98% 98% 99%  Weight:      Height:        General: Pt is alert, awake, not in acute distress Cardiovascular: RRR, S1/S2 +, no rubs, no gallops Respiratory: CTA bilaterally, no wheezing, no rhonchi Abdominal: Soft, NT, ND, bowel sounds + Extremities: no edema, no cyanosis   The results of significant diagnostics from this hospitalization (including imaging, microbiology, ancillary and laboratory) are listed below for reference.    Microbiology: Recent Results (from the past 240 hour(s))  Blood culture (routine x 2)     Status: None (Preliminary result)   Collection Time: 01/17/20  1:51 PM   Specimen: BLOOD  Result Value Ref Range Status   Specimen Description BLOOD RIGHT ANTECUBITAL  Final   Special Requests   Final    BOTTLES DRAWN AEROBIC AND ANAEROBIC Blood Culture adequate volume   Culture   Final    NO GROWTH < 24 HOURS Performed at [facility], [address], [place], [place] [address]    Report Status PENDING  Incomplete  Resp Panel by RT-PCR (Flu A&B, Covid) Nasopharyngeal Swab     Status: None   Collection Time: 01/17/20  5:54 PM   Specimen: Nasopharyngeal Swab; Nasopharyngeal(NP) swabs in vial transport medium  Result Value Ref Range Status   SARS Coronavirus 2 by RT PCR NEGATIVE NEGATIVE Final    Comment: (NOTE) SARS-CoV-2 target nucleic acids are NOT DETECTED.  The SARS-CoV-2 RNA is generally detectable in upper respiratory specimens during the acute phase of infection. The lowest concentration of SARS-CoV-2 viral copies this assay can detect is 138 copies/mL. A negative  result does not preclude SARS-Cov-2 infection and should not be used as the sole basis for treatment or other patient management decisions. A negative result may occur with  improper specimen collection/handling, submission of specimen other than nasopharyngeal swab, presence of viral mutation(s) within the areas targeted by this assay, and inadequate number of viral copies(<138 copies/mL). A negative result must be combined with clinical observations, patient history, and epidemiological information. The expected result is Negative.  Fact Sheet for Patients:  EntrepreneurPulse.com.au  Fact Sheet for Healthcare Providers:  IncredibleEmployment.be  This test is no t yet approved or cleared by the [place] FDA and  has been authorized for detection and/or diagnosis of SARS-CoV-2 by FDA under an Emergency Use Authorization (EUA). This EUA will remain  in effect (meaning this test can be used) for the duration of the COVID-19 declaration under Section 564(b)(1) of the Act, 21 U.S.C.section 360bbb-3(b)(1), unless the authorization is terminated  or revoked sooner.       Influenza A by PCR NEGATIVE NEGATIVE Final   Influenza B by PCR NEGATIVE NEGATIVE Final    Comment: (NOTE) The Xpert Xpress SARS-CoV-2/FLU/RSV plus  assay is intended as an aid in the diagnosis of influenza from Nasopharyngeal swab specimens and should not be used as a sole basis for treatment. Nasal washings and aspirates are unacceptable for Xpert Xpress SARS-CoV-2/FLU/RSV testing.  Fact Sheet for Patients: EntrepreneurPulse.com.au  Fact Sheet for Healthcare Providers: IncredibleEmployment.be  This test is not yet approved or cleared by the [place] FDA and has been authorized for detection and/or diagnosis of SARS-CoV-2 by FDA under an Emergency Use Authorization (EUA). This EUA will remain in effect (meaning this test can be used)  for the duration of the COVID-19 declaration under Section 564(b)(1) of the Act, 21 U.S.C. section 360bbb-3(b)(1), unless the authorization is terminated or revoked.  Performed at [facility], [address] [place], [place] [address]   Blood culture (routine x 2)     Status: None (Preliminary result)   Collection Time: 01/17/20  6:11 PM   Specimen: BLOOD  Result Value Ref Range Status   Specimen Description BLOOD BLOOD RIGHT HAND  Final   Special Requests   Final    BOTTLES DRAWN AEROBIC AND ANAEROBIC Blood Culture results may not be optimal due to an inadequate volume of blood received in culture bottles   Culture   Final    NO GROWTH < 12 HOURS Performed at [facility], [address] [place], [place] [address]    Report Status PENDING  Incomplete     Labs: BNP (last 3 results) No results for input(s): BNP in the last 8760 hours. Basic Metabolic Panel: Recent Labs  Lab 01/17/20 1339  NA 141  K 4.7  CL 113*  CO2 19*  GLUCOSE 179*  BUN 32*  CREATININE 1.68*  CALCIUM 8.9   Liver Function Tests: Recent Labs  Lab 01/17/20 1339  AST 18  ALT 10  ALKPHOS 90  BILITOT 0.4  PROT 6.8  ALBUMIN 3.7   No results for input(s): LIPASE, AMYLASE in the last 168 hours. No results for input(s): AMMONIA in the last 168 hours. CBC: Recent Labs  Lab 01/17/20 1339  WBC 11.8*  HGB 10.9*  HCT 36.2  MCV 93.1  PLT 313   Cardiac Enzymes: No results for input(s): CKTOTAL, CKMB, CKMBINDEX, TROPONINI in the last 168 hours. BNP: Invalid input(s): POCBNP CBG: No results for input(s): GLUCAP in the last 168 hours. D-Dimer No results for input(s): DDIMER in the last 72 hours. Hgb A1c No results for input(s): HGBA1C in the last 72 hours. Lipid Profile No results for input(s): CHOL, HDL, LDLCALC, TRIG, CHOLHDL, LDLDIRECT in the last 72 hours. Thyroid function studies No results for input(s): TSH, T4TOTAL, T3FREE, THYROIDAB in the last 72 hours.  Invalid  input(s): FREET3 Anemia work up No results for input(s): VITAMINB12, FOLATE, FERRITIN, TIBC, IRON, RETICCTPCT in the last 72 hours. Urinalysis    Component Value Date/Time   COLORURINE AMBER (A) 01/17/2020 1339   APPEARANCEUR CLOUDY (A) 01/17/2020 1339   APPEARANCEUR Clear 01/06/2014 0948   LABSPEC 1.013 01/17/2020 1339   LABSPEC 1.010 01/06/2014 0948   PHURINE 5.0 01/17/2020 1339   GLUCOSEU NEGATIVE 01/17/2020 1339   GLUCOSEU Negative 01/06/2014 0948   HGBUR NEGATIVE 01/17/2020 1339   BILIRUBINUR NEGATIVE 01/17/2020 1339   BILIRUBINUR Negative 01/06/2014 0948   KETONESUR NEGATIVE 01/17/2020 1339   PROTEINUR 100 (A) 01/17/2020 1339   NITRITE NEGATIVE 01/17/2020 1339   LEUKOCYTESUR NEGATIVE 01/17/2020 1339   LEUKOCYTESUR Negative 01/06/2014 0948   Sepsis Labs Invalid input(s): PROCALCITONIN,  WBC,  LACTICIDVEN Microbiology Recent Results (from  the past 240 hour(s))  Blood culture (routine x 2)     Status: None (Preliminary result)   Collection Time: 01/17/20  1:51 PM   Specimen: BLOOD  Result Value Ref Range Status   Specimen Description BLOOD RIGHT ANTECUBITAL  Final   Special Requests   Final    BOTTLES DRAWN AEROBIC AND ANAEROBIC Blood Culture adequate volume   Culture   Final    NO GROWTH < 24 HOURS Performed at [facility], [address] [place], [place] [address]    Report Status PENDING  Incomplete  Resp Panel by RT-PCR (Flu A&B, Covid) Nasopharyngeal Swab     Status: None   Collection Time: 01/17/20  5:54 PM   Specimen: Nasopharyngeal Swab; Nasopharyngeal(NP) swabs in vial transport medium  Result Value Ref Range Status   SARS Coronavirus 2 by RT PCR NEGATIVE NEGATIVE Final    Comment: (NOTE) SARS-CoV-2 target nucleic acids are NOT DETECTED.  The SARS-CoV-2 RNA is generally detectable in upper respiratory specimens during the acute phase of infection. The lowest concentration of SARS-CoV-2 viral copies this assay can detect is 138 copies/mL. A  negative result does not preclude SARS-Cov-2 infection and should not be used as the sole basis for treatment or other patient management decisions. A negative result may occur with  improper specimen collection/handling, submission of specimen other than nasopharyngeal swab, presence of viral mutation(s) within the areas targeted by this assay, and inadequate number of viral copies(<138 copies/mL). A negative result must be combined with clinical observations, patient history, and epidemiological information. The expected result is Negative.  Fact Sheet for Patients:  EntrepreneurPulse.com.au  Fact Sheet for Healthcare Providers:  IncredibleEmployment.be  This test is no t yet approved or cleared by the [place] FDA and  has been authorized for detection and/or diagnosis of SARS-CoV-2 by FDA under an Emergency Use Authorization (EUA). This EUA will remain  in effect (meaning this test can be used) for the duration of the COVID-19 declaration under Section 564(b)(1) of the Act, 21 U.S.C.section 360bbb-3(b)(1), unless the authorization is terminated  or revoked sooner.       Influenza A by PCR NEGATIVE NEGATIVE Final   Influenza B by PCR NEGATIVE NEGATIVE Final    Comment: (NOTE) The Xpert Xpress SARS-CoV-2/FLU/RSV plus assay is intended as an aid in the diagnosis of influenza from Nasopharyngeal swab specimens and should not be used as a sole basis for treatment. Nasal washings and aspirates are unacceptable for Xpert Xpress SARS-CoV-2/FLU/RSV testing.  Fact Sheet for Patients: EntrepreneurPulse.com.au  Fact Sheet for Healthcare Providers: IncredibleEmployment.be  This test is not yet approved or cleared by the [place] FDA and has been authorized for detection and/or diagnosis of SARS-CoV-2 by FDA under an Emergency Use Authorization (EUA). This EUA will remain in effect (meaning this test can  be used) for the duration of the COVID-19 declaration under Section 564(b)(1) of the Act, 21 U.S.C. section 360bbb-3(b)(1), unless the authorization is terminated or revoked.  Performed at [facility], [address] [place], [place] [address]   Blood culture (routine x 2)     Status: None (Preliminary result)   Collection Time: 01/17/20  6:11 PM   Specimen: BLOOD  Result Value Ref Range Status   Specimen Description BLOOD BLOOD RIGHT HAND  Final   Special Requests   Final    BOTTLES DRAWN AEROBIC AND ANAEROBIC Blood Culture results may not be optimal due to an inadequate volume of blood received in culture bottles  Culture   Final    NO GROWTH < 12 HOURS Performed at [facility], [place], [place] [address]    Report Status PENDING  Incomplete    Time coordinating discharge: Over 30 minutes  SIGNED:  Lorella Nimrod, MD  [facility] 01/18/2020, 10:25 AM  If 7PM-7AM, please contact night-coverage [email or web address]  This record has been created using Systems analyst. Errors have been sought and corrected,but may not always be located. Such creation errors do not reflect on the standard of care.

## 2020-01-18 NOTE — Progress Notes (Signed)
Patient was given written and verbal discharge instructions including new medications and follow up appointments.. Son is at bedside. IV's removed and personal belongings were sent home with patient.

## 2020-01-18 NOTE — ED Notes (Signed)
Pt being transported to rm 111 via stretcher at this time.

## 2020-01-18 NOTE — Discharge Instructions (Signed)
Dehydration, Adult Dehydration is condition in which there is not enough water or other fluids in the body. This happens when a person loses more fluids than he or she takes in. Important body parts cannot work right without the right amount of fluids. Any loss of fluids from the body can cause dehydration. Dehydration can be mild, worse, or very bad. It should be treated right away to keep it from getting very bad. What are the causes? This condition may be caused by:  Conditions that cause loss of water or other fluids, such as: ? Watery poop (diarrhea). ? Vomiting. ? Sweating a lot. ? Peeing (urinating) a lot.  Not drinking enough fluids, especially when you: ? Are ill. ? Are doing things that take a lot of energy to do.  Other illnesses and conditions, such as fever or infection.  Certain medicines, such as medicines that take extra fluid out of the body (diuretics).  Lack of safe drinking water.  Not being able to get enough water and food. What increases the risk? The following factors may make you more likely to develop this condition:  Having a long-term (chronic) illness that has not been treated the right way, such as: ? Diabetes. ? Heart disease. ? Kidney disease.  Being 65 years of age or older.  Having a disability.  Living in a place that is high above the ground or sea (high in altitude). The thinner, dried air causes more fluid loss.  Doing exercises that put stress on your body for a long time. What are the signs or symptoms? Symptoms of dehydration depend on how bad it is. Mild or worse dehydration  Thirst.  Dry lips or dry mouth.  Feeling dizzy or light-headed, especially when you stand up from sitting.  Muscle cramps.  Your body making: ? Dark pee (urine). Pee may be the color of tea. ? Less pee than normal. ? Less tears than normal.  Headache. Very bad dehydration  Changes in skin. Skin may: ? Be cold to the touch (clammy). ? Be blotchy  or pale. ? Not go back to normal right after you lightly pinch it and let it go.  Little or no tears, pee, or sweat.  Changes in vital signs, such as: ? Fast breathing. ? Low blood pressure. ? Weak pulse. ? Pulse that is more than 100 beats a minute when you are sitting still.  Other changes, such as: ? Feeling very thirsty. ? Eyes that look hollow (sunken). ? Cold hands and feet. ? Being mixed up (confused). ? Being very tired (lethargic) or having trouble waking from sleep. ? Short-term weight loss. ? Loss of consciousness. How is this treated? Treatment for this condition depends on how bad it is. Treatment should start right away. Do not wait until your condition gets very bad. Very bad dehydration is an emergency. You will need to go to a hospital.  Mild or worse dehydration can be treated at home. You may be asked to: ? Drink more fluids. ? Drink an oral rehydration solution (ORS). This drink helps get the right amounts of fluids and salts and minerals in the blood (electrolytes).  Very bad dehydration can be treated: ? With fluids through an IV tube. ? By getting normal levels of salts and minerals in your blood. This is often done by giving salts and minerals through a tube. The tube is passed through your nose and into your stomach. ? By treating the root cause. Follow these instructions at   home: Oral rehydration solution If told by your doctor, drink an ORS:  Make an ORS. Use instructions on the package.  Start by drinking small amounts, about  cup (120 mL) every 5-10 minutes.  Slowly drink more until you have had the amount that your doctor said to have. Eating and drinking         Drink enough clear fluid to keep your pee pale yellow. If you were told to drink an ORS, finish the ORS first. Then, start slowly drinking other clear fluids. Drink fluids such as: ? Water. Do not drink only water. Doing that can make the salt (sodium) level in your body get too  low. ? Water from ice chips you suck on. ? Fruit juice that you have added water to (diluted). ? Low-calorie sports drinks.  Eat foods that have the right amounts of salts and minerals, such as: ? Bananas. ? Oranges. ? Potatoes. ? Tomatoes. ? Spinach.  Do not drink alcohol.  Avoid: ? Drinks that have a lot of sugar. These include:  High-calorie sports drinks.  Fruit juice that you did not add water to.  Soda.  Caffeine. ? Foods that are greasy or have a lot of fat or sugar. General instructions  Take over-the-counter and prescription medicines only as told by your doctor.  Do not take salt tablets. Doing that can make the salt level in your body get too high.  Return to your normal activities as told by your doctor. Ask your doctor what activities are safe for you.  Keep all follow-up visits as told by your doctor. This is important. Contact a doctor if:  You have pain in your belly (abdomen) and the pain: ? Gets worse. ? Stays in one place.  You have a rash.  You have a stiff neck.  You get angry or annoyed (irritable) more easily than normal.  You are more tired or have a harder time waking than normal.  You feel: ? Weak or dizzy. ? Very thirsty. Get help right away if you have:  Any symptoms of very bad dehydration.  Symptoms of vomiting, such as: ? You cannot eat or drink without vomiting. ? Your vomiting gets worse or does not go away. ? Your vomit has blood or green stuff in it.  Symptoms that get worse with treatment.  A fever.  A very bad headache.  Problems with peeing or pooping (having a bowel movement), such as: ? Watery poop that gets worse or does not go away. ? Blood in your poop (stool). This may cause poop to look black and tarry. ? Not peeing in 6-8 hours. ? Peeing only a small amount of very dark pee in 6-8 hours.  Trouble breathing. These symptoms may be an emergency. Do not wait to see if the symptoms will go away. Get  medical help right away. Call your local emergency services (911 in the [place] Do not drive yourself to the hospital. Summary  Dehydration is a condition in which there is not enough water or other fluids in the body. This happens when a person loses more fluids than he or she takes in.  Treatment for this condition depends on how bad it is. Treatment should be started right away. Do not wait until your condition gets very bad.  Drink enough clear fluid to keep your pee pale yellow. If you were told to drink an oral rehydration solution (ORS), finish the ORS first. Then, start slowly drinking other clear fluids.  Take over-the-counter and prescription medicines only as told by your doctor.  Get help right away if you have any symptoms of very bad dehydration. This information is not intended to replace advice given to you by your health care provider. Make sure you discuss any questions you have with your health care provider. Document Revised: 08/24/2018 Document Reviewed: 08/24/2018 Elsevier Patient Education  [place].   Dehydration, Elderly  Dehydration is condition in which there is not enough water or other fluids in the body. This happens when a person loses more fluids than he or she takes in. Important body parts cannot work right without the right amount of fluids. Any loss of fluids from the body can cause dehydration. People 21 years of age or older have a higher risk of dehydration than younger adults. This is because in older age, the body:  Is less able to keep the right amount of water.  Does not respond to temperature changes as well.  Does not get a sense of thirst as easily or quickly. Dehydration can be mild, worse, or very bad. It should be treated right away to keep it from getting very bad. What are the causes? This condition may be caused by:  Conditions that cause loss of water or other fluids, such as: ? Watery poop  (diarrhea). ? Vomiting. ? Sweating a lot. ? Peeing (urinating) a lot.  Not drinking enough fluids, especially when you: ? Are ill. ? Are doing things that take a lot of energy to do.  Other illnesses and conditions, such as fever or infection.  Certain medicines, such as medicines that take extra fluid out of the body (diuretics).  Lack of safe drinking water.  Not being able to get enough water and food. What increases the risk? The following factors may make you more likely to develop this condition:  Having a long-term (chronic) illness that has not been treated the right way, such as: ? An illness that may cause you to pee more, such as diabetes. ? Kidney, heart, or lung disease. ? A condition such as dementia. This affects:  The brain and nervous system.  Thinking.  Feelings.  Being 21 years of age or older.  Having a disability.  Living in a place that is high above the ground or sea (high in altitude). The thinner, drier air causes more fluid loss. What are the signs or symptoms? Symptoms of dehydration depend on how bad it is. Mild or worse dehydration  Thirst.  Dry lips or dry mouth.  Feeling dizzy or light-headed, especially when you stand up from sitting.  Muscle cramps.  Your body making: ? Dark pee (urine). Pee may be the color of tea. ? Less pee than normal. ? Less tears than normal.  Headache. Very bad dehydration  Changes in skin. Skin may: ? Be cold to the touch (clammy). ? Be blotchy or pale. ? Not go back to normal right after you lightly pinch it and let it go.  Little or no tears, pee, or sweat.  Changes in vital signs, such as: ? Fast breathing. ? Low blood pressure. ? Weak pulse. ? Pulse that is more than 100 beats a minute when you are sitting still.  Other changes, such as: ? Feeling very thirsty. ? Eyes that look hollow (sunken). ? Cold hands and feet. ? Being mixed up (confused). ? Being very tired (lethargic) or  having trouble waking from sleep. ? Short-term weight loss. ? Loss of consciousness. How  is this treated? Treatment for this condition depends on how bad it is. Treatment should start right away. Do not wait until your condition gets very bad. Very bad dehydration is an emergency. You will need to go to a hospital.  Mild or worse dehydration can be treated at home. You may be asked to: ? Drink more fluids. ? Drink an oral rehydration solution (ORS). This drink helps get the right amounts of fluids and salts and minerals in the blood (electrolytes).  Very bad dehydration can be treated: ? With fluids through an IV tube. ? By getting normal levels of salts and minerals in your blood. This is often done by giving salts and minerals through a tube. The tube is passed through your nose and into your stomach. ? By treating the root cause. Follow these instructions at home: Oral rehydration solution If told by your doctor, drink an ORS:  Make an ORS. Use instructions on the package.  Start by drinking small amounts, about  cup (120 mL) every 5-10 minutes.  Slowly drink more until you have had the amount that your doctor said to have. Eating and drinking         Drink enough clear fluid to keep your pee pale yellow. If you were told to drink an ORS, finish the ORS first. Then, start slowly drinking other clear fluids. Drink fluids such as: ? Water. Do not drink only water. Doing that can make the salt (sodium) level in your body get too low. ? Water from ice chips you suck on. ? Fruit juice that you have added water to (diluted). ? Low-calorie sports drinks.  Eat foods that have the right amounts of salts and minerals, such as: ? Bananas. ? Oranges. ? Potatoes. ? Tomatoes. ? Spinach.  Do not drink alcohol.  Avoid: ? Drinks that have a lot of sugar. These include:  High-calorie sports drinks.  Fruit juice that you did not add water to.  Soda.  Caffeine. ? Foods that are  greasy or have a lot of fat or sugar. General instructions  Take over-the-counter and prescription medicines only as told by your doctor.  Do not take salt tablets. Doing that can make the salt level in your body get too high.  Return to your normal activities as told by your doctor. Ask your doctor what activities are safe for you.  Keep all follow-up visits as told by your doctor. This is important. Contact a doctor if:  You have pain in your belly (abdomen) and the pain: ? Gets worse. ? Stays in one place.  You have a rash.  You have a stiff neck.  You get angry or annoyed (irritable) more easily than normal.  You are more tired or have a harder time waking than normal.  You feel: ? Weak or dizzy. ? Very thirsty. Get help right away if you have:  Any symptoms of very bad dehydration.  A fever.  A very bad headache.  Symptoms of vomiting, such as: ? Your vomiting gets worse or does not go away. ? Your vomit has blood or green stuff in it. ? You cannot eat or drink without vomiting.  Problems with peeing or pooping (having a bowel movement), such as: ? Watery poop that gets worse or does not go away. ? Blood in your poop (stool). This may cause poop to look black and tarry. ? Not peeing in 6-8 hours. ? Peeing only a small amount of very dark pee in 6-8  hours.  Trouble breathing.  Symptoms that get worse with treatment. These symptoms may be an emergency. Do not wait to see if the symptoms will go away. Get medical help right away. Call your local emergency services (911 in the [place] Do not drive yourself to the hospital. Summary  Dehydration is a condition in which there is not enough water or other fluids in the body. This happens when a person loses more fluids than he or she takes in.  Treatment for this condition depends on how bad it is. Treatment should be started right away. Do not wait until your condition gets very bad.  Drink enough clear fluid to  keep your pee pale yellow. If you were told to drink an oral rehydration solution (ORS), finish the ORS first. Then, start slowly drinking other clear fluids.  Take over-the-counter and prescription medicines only as told by your doctor.  Get help right away if you have any symptoms of very bad dehydration. This information is not intended to replace advice given to you by your health care provider. Make sure you discuss any questions you have with your health care provider. Document Revised: 08/24/2018 Document Reviewed: 08/24/2018 Elsevier Patient Education  [place].   Diverticulitis  Diverticulitis is when small pockets in your large intestine (colon) get infected or swollen. This causes stomach pain and watery poop (diarrhea). These pouches are called diverticula. They form in people who have a condition called diverticulosis. Follow these instructions at home: Medicines  Take over-the-counter and prescription medicines only as told by your doctor. These include: ? Antibiotics. ? Pain medicines. ? Fiber pills. ? Probiotics. ? Stool softeners.  Do not drive or use heavy machinery while taking prescription pain medicine.  If you were prescribed an antibiotic, take it as told. Do not stop taking it even if you feel better. General instructions   Follow a diet as told by your doctor.  When you feel better, your doctor may tell you to change your diet. You may need to eat a lot of fiber. Fiber makes it easier to poop (have bowel movements). Healthy foods with fiber include: ? Berries. ? Beans. ? Lentils. ? Green vegetables.  Exercise 3 or more times a week. Aim for 30 minutes each time. Exercise enough to sweat and make your heart beat faster.  Keep all follow-up visits as told. This is important. You may need to have an exam of the large intestine. This is called a colonoscopy. Contact a doctor if:  Your pain does not get better.  You have a hard time eating or  drinking.  You are not pooping like normal. Get help right away if:  Your pain gets worse.  Your problems do not get better.  Your problems get worse very fast.  You have a fever.  You throw up (vomit) more than one time.  You have poop that is: ? Bloody. ? Black. ? Tarry. Summary  Diverticulitis is when small pockets in your large intestine (colon) get infected or swollen.  Take medicines only as told by your doctor.  Follow a diet as told by your doctor. This information is not intended to replace advice given to you by your health care provider. Make sure you discuss any questions you have with your health care provider. Document Revised: 12/24/2016 Document Reviewed: 01/29/2016 Elsevier Patient Education  [place].

## 2020-01-18 NOTE — Progress Notes (Signed)
PHARMACY NOTE:  ANTIMICROBIAL RENAL DOSAGE ADJUSTMENT  Current antimicrobial regimen includes a mismatch between antimicrobial dosage and estimated renal function.  As per policy approved by the Pharmacy & Therapeutics and Medical Executive Committees, the antimicrobial dosage will be adjusted accordingly.  Current antimicrobial dosage:  Augmentin XR 1000mg /62.5mg  PO q12h  Estimated Creatinine Clearance: 18.4 mL/min (A) (by C-G formula based on SCr of 1.68 mg/dL (H)).    Antimicrobial dosage has been changed to:  Augmentin 500mg /125mg  PO q12h  Additional comments: use of extended release amoxicillin/clavulanate not recommended for creatinine clearance less than 80ml/min   Thank you for allowing pharmacy to be a part of this patient's care.   Brendolyn Patty, PharmD Clinical Pharmacist  01/18/2020   9:01 AM

## 2020-01-22 LAB — CULTURE, BLOOD (ROUTINE X 2)
Culture: NO GROWTH
Culture: NO GROWTH
Special Requests: ADEQUATE

## 2020-02-20 ENCOUNTER — Other Ambulatory Visit: Payer: Self-pay | Admitting: Nurse Practitioner

## 2020-02-20 DIAGNOSIS — S065X9A Traumatic subdural hemorrhage with loss of consciousness of unspecified duration, initial encounter: Secondary | ICD-10-CM

## 2020-02-20 DIAGNOSIS — S065XAA Traumatic subdural hemorrhage with loss of consciousness status unknown, initial encounter: Secondary | ICD-10-CM

## 2020-03-04 ENCOUNTER — Ambulatory Visit
Admission: RE | Admit: 2020-03-04 | Discharge: 2020-03-04 | Disposition: A | Discharge status: Home / Self Care | Payer: Medicare Other | Source: Ambulatory Visit | Attending: Nurse Practitioner | Admitting: Nurse Practitioner

## 2020-03-04 ENCOUNTER — Other Ambulatory Visit: Payer: Self-pay

## 2020-03-04 DIAGNOSIS — S065XAA Traumatic subdural hemorrhage with loss of consciousness status unknown, initial encounter: Secondary | ICD-10-CM

## 2020-03-04 DIAGNOSIS — S065X9A Traumatic subdural hemorrhage with loss of consciousness of unspecified duration, initial encounter: Secondary | ICD-10-CM | POA: Insufficient documentation

## 2020-05-25 DEATH — deceased

## 2021-02-14 IMAGING — CT CT HEAD W/O CM
3 series · 15 of 47 positions shown, 18 images · non-contrast
Comparison: 01/17/2020 at [DATE] p.m.

CLINICAL DATA: Follow-up subdural hematoma.

EXAM:
CT HEAD WITHOUT CONTRAST
TECHNIQUE: Contiguous axial images were obtained from the base of the skull
through the vertex without intravenous contrast.

[Series 2: head wo · axial · 0.41mm/px · z∈[-154,-14]mm · 9 of 34 slices shown, 12 images]
[im 3/34  brain]
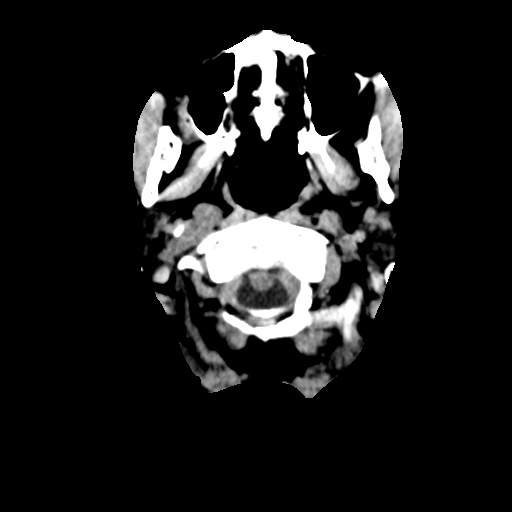
[im 3/34  bone]
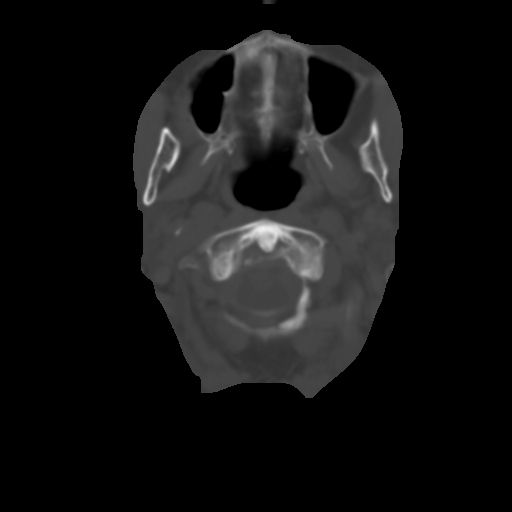
[im 6/34  brain]
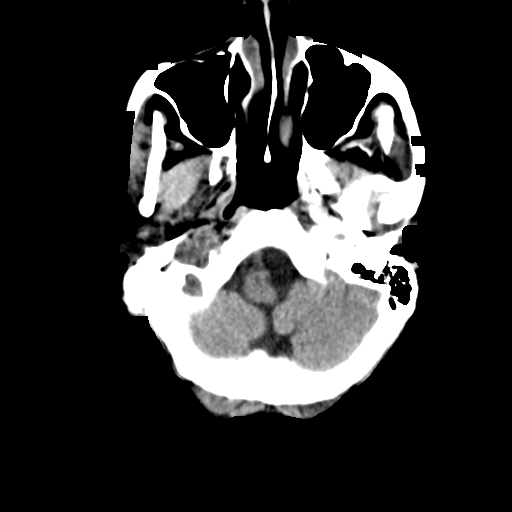
[im 10/34  brain]
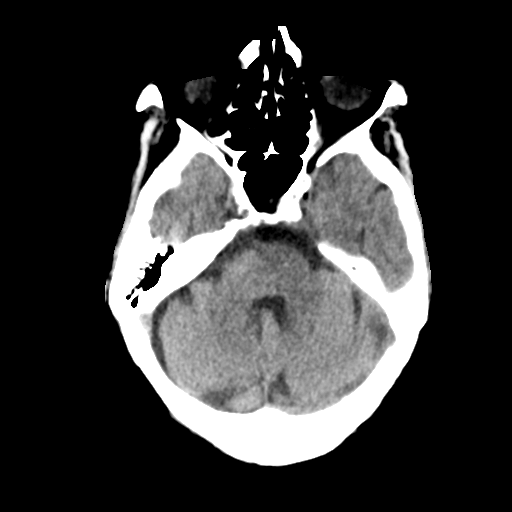
[im 13/34  brain]
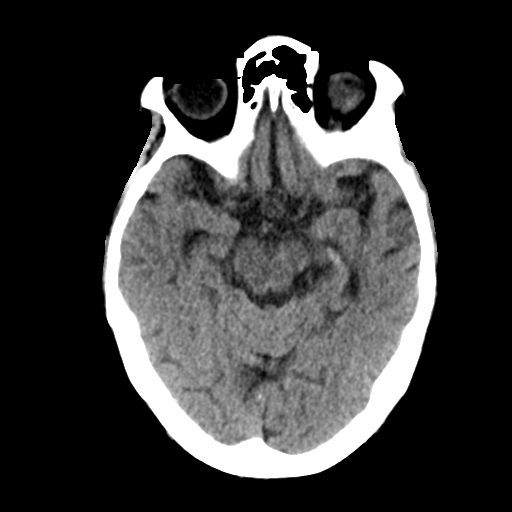
[im 18/34  brain]
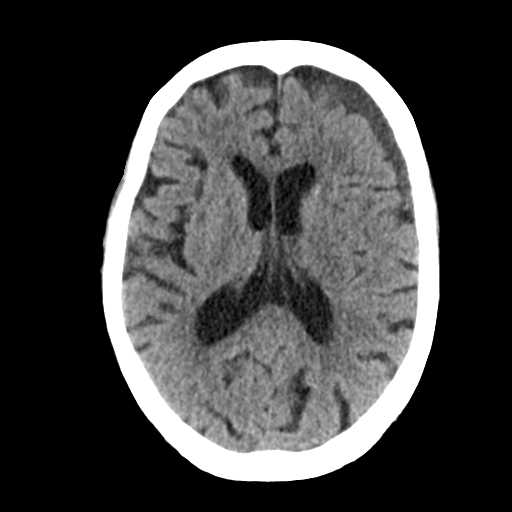
[im 18/34  bone]
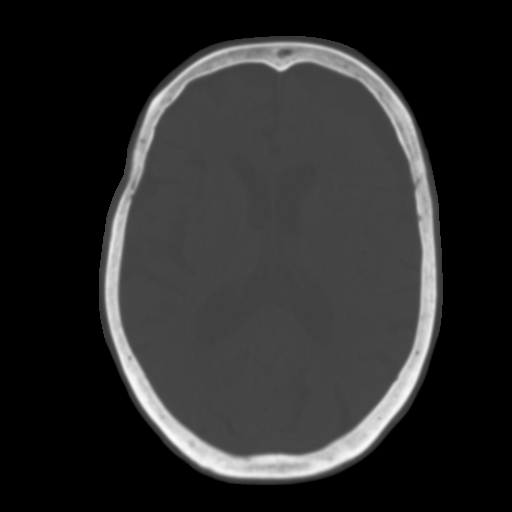
[im 21/34  brain]
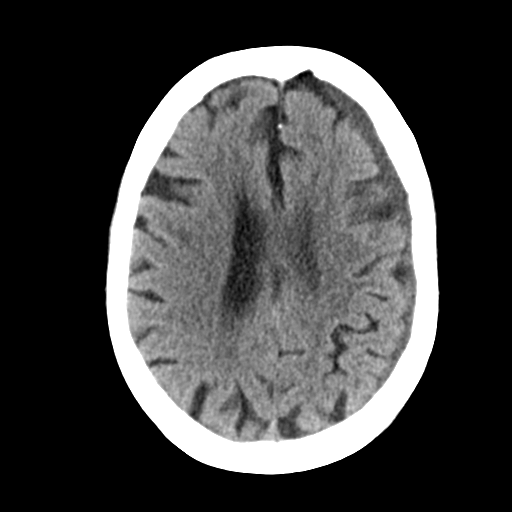
[im 24/34  brain]
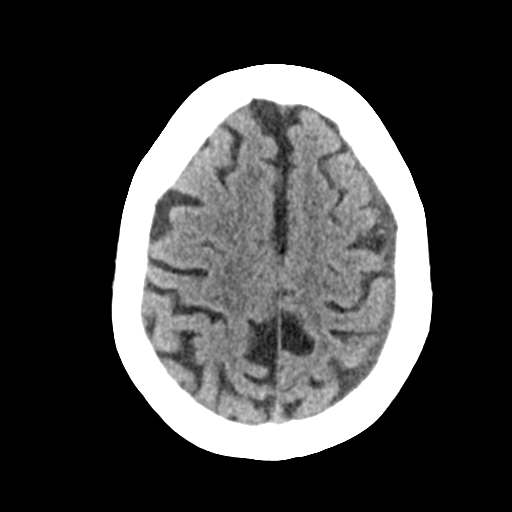
[im 28/34  brain]
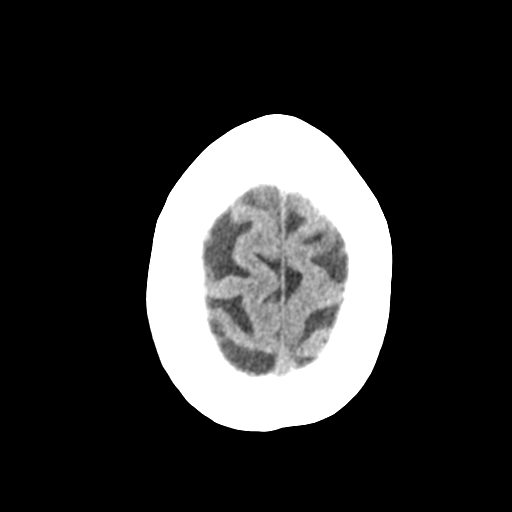
[im 31/34  brain]
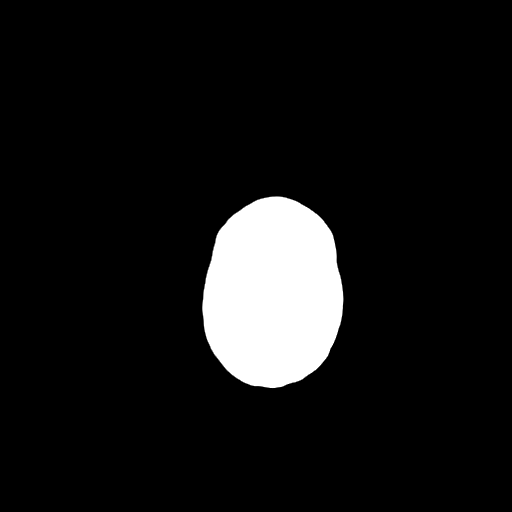
[im 31/34  bone]
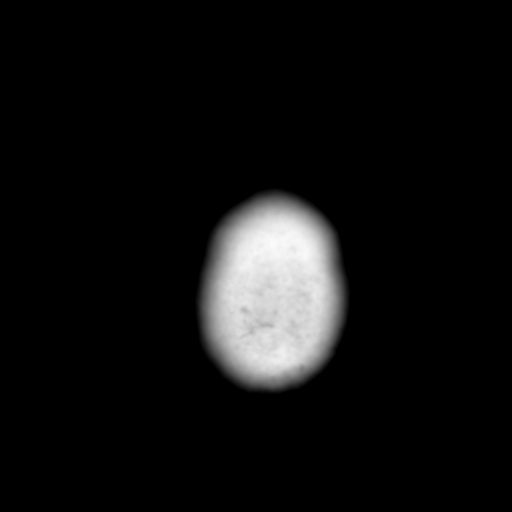

[Series 4: coronal soft tissue · coronal · 0.31mm/px · 3 of 67 slices shown]
[im 23/67  brain]
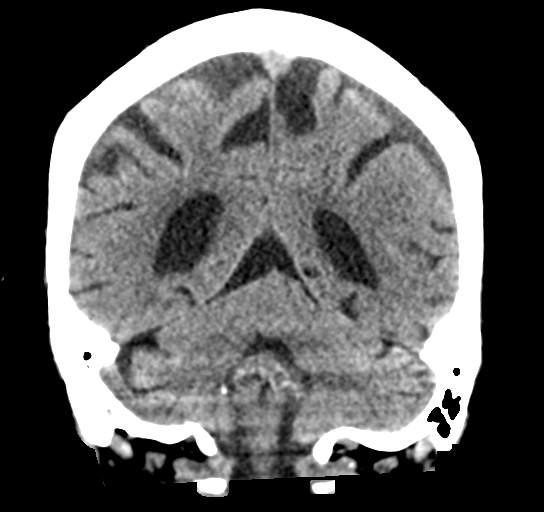
[im 30/67  brain]
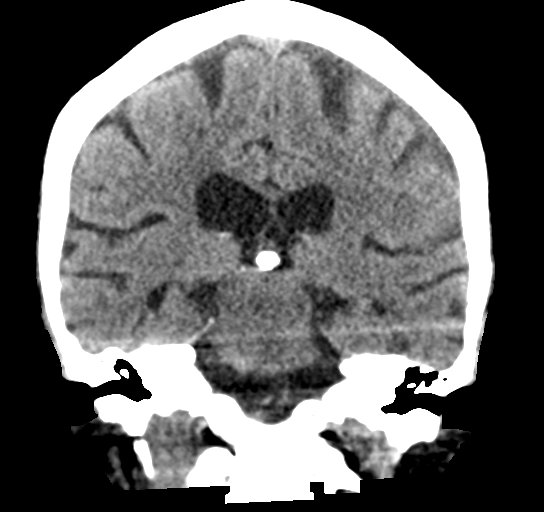
[im 37/67  brain]
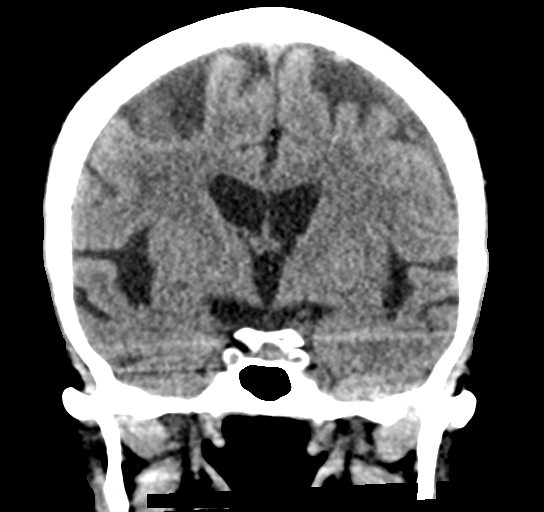

[Series 5: sagittal soft tissue · sagittal · 0.31mm/px · 3 of 56 slices shown]
[im 19/56  brain]
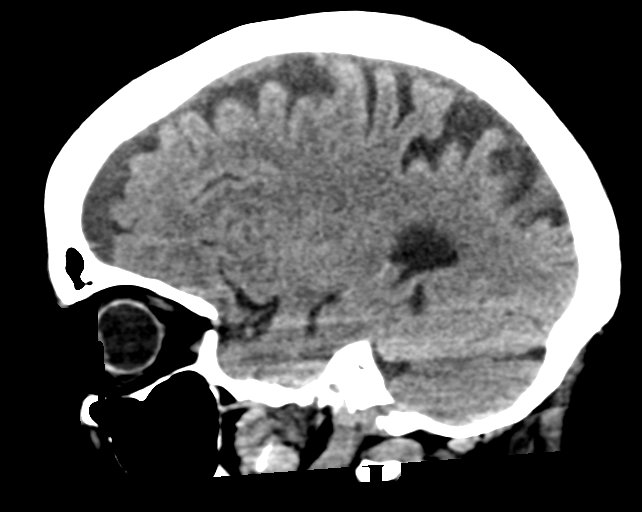
[im 28/56  brain]
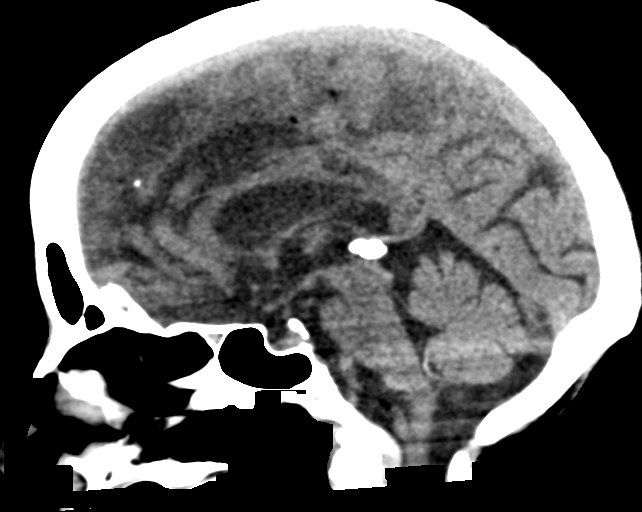
[im 37/56  brain]
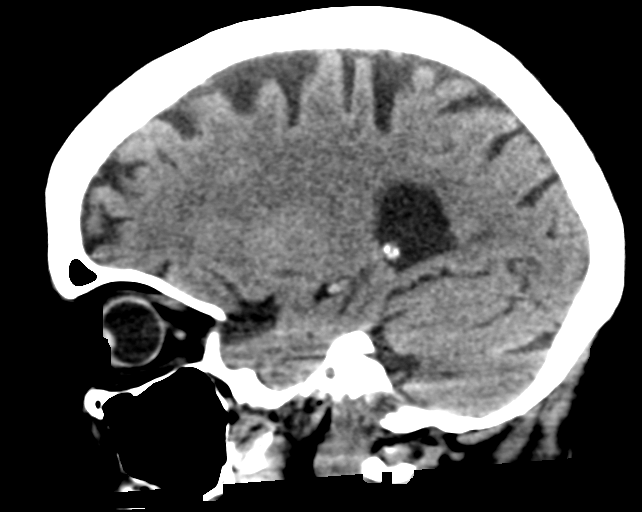

[15 of 47 positions shown; findings below may reference images not displayed]

FINDINGS: Brain: A small intermediate to low density subdural hematoma over
the left cerebral convexity is unchanged in size measuring up to 7
mm in thickness. Mild mass effect on the left frontal lobe is
unchanged without midline shift. No new intracranial hemorrhage,
acute infarct, or mass is identified. There is a chronic punctate
calcification in the dorsal right pons. Hypodensities in the
cerebral white matter bilaterally are unchanged and nonspecific but
compatible with mild chronic small vessel ischemic disease, not
considered abnormal for age. There is mild cerebral atrophy.

Vascular: Calcified atherosclerosis at the skull base.

Skull: No acute fracture or suspicious osseous lesion.

Sinuses/Orbits: Clear paranasal sinuses. Unchanged right mastoid
effusion. Left cataract extraction.

Other: None.
IMPRESSION: 1. Unchanged small left cerebral convexity subdural hematoma. No
midline shift.
2. No evidence of new intracranial abnormality.
